# Patient Record
Sex: Female | Born: 1935 | Race: White | Hispanic: No | Marital: Married | Smoking: Never smoker
Health system: Southern US, Community
[De-identification: ages and names within clinical notes are randomized; demographics above are authoritative.]

## PROBLEM LIST (undated history)

## (undated) DIAGNOSIS — I4891 Unspecified atrial fibrillation: Secondary | ICD-10-CM

## (undated) DIAGNOSIS — N390 Urinary tract infection, site not specified: Secondary | ICD-10-CM

## (undated) DIAGNOSIS — K219 Gastro-esophageal reflux disease without esophagitis: Secondary | ICD-10-CM

## (undated) DIAGNOSIS — M810 Age-related osteoporosis without current pathological fracture: Secondary | ICD-10-CM

## (undated) DIAGNOSIS — E079 Disorder of thyroid, unspecified: Secondary | ICD-10-CM

## (undated) DIAGNOSIS — I10 Essential (primary) hypertension: Secondary | ICD-10-CM

## (undated) DIAGNOSIS — K5900 Constipation, unspecified: Secondary | ICD-10-CM

---

## 2000-03-06 ENCOUNTER — Encounter: Admission: RE | Admit: 2000-03-06 | Discharge: 2000-03-06 | Payer: Self-pay | Admitting: Family Medicine

## 2000-03-06 ENCOUNTER — Encounter: Payer: Self-pay | Admitting: Family Medicine

## 2000-10-25 ENCOUNTER — Encounter: Payer: Self-pay | Admitting: Family Medicine

## 2000-10-25 ENCOUNTER — Encounter: Admission: RE | Admit: 2000-10-25 | Discharge: 2000-10-25 | Payer: Self-pay | Admitting: Family Medicine

## 2000-10-31 ENCOUNTER — Encounter: Admission: RE | Admit: 2000-10-31 | Discharge: 2000-10-31 | Payer: Self-pay | Admitting: Family Medicine

## 2000-10-31 ENCOUNTER — Encounter: Payer: Self-pay | Admitting: Family Medicine

## 2001-05-18 ENCOUNTER — Encounter: Payer: Self-pay | Admitting: Family Medicine

## 2001-05-18 ENCOUNTER — Encounter: Admission: RE | Admit: 2001-05-18 | Discharge: 2001-05-18 | Payer: Self-pay | Admitting: Family Medicine

## 2001-12-04 ENCOUNTER — Encounter: Admission: RE | Admit: 2001-12-04 | Discharge: 2001-12-04 | Payer: Self-pay | Admitting: Family Medicine

## 2001-12-04 ENCOUNTER — Encounter: Payer: Self-pay | Admitting: Family Medicine

## 2001-12-25 ENCOUNTER — Encounter: Admission: RE | Admit: 2001-12-25 | Discharge: 2001-12-25 | Payer: Self-pay | Admitting: Family Medicine

## 2001-12-25 ENCOUNTER — Encounter: Payer: Self-pay | Admitting: Family Medicine

## 2002-10-03 ENCOUNTER — Encounter: Payer: Self-pay | Admitting: Family Medicine

## 2002-10-03 ENCOUNTER — Encounter: Admission: RE | Admit: 2002-10-03 | Discharge: 2002-10-03 | Payer: Self-pay | Admitting: Family Medicine

## 2003-02-07 ENCOUNTER — Encounter: Admission: RE | Admit: 2003-02-07 | Discharge: 2003-02-07 | Payer: Self-pay | Admitting: Family Medicine

## 2003-02-07 ENCOUNTER — Encounter: Payer: Self-pay | Admitting: Family Medicine

## 2003-04-17 ENCOUNTER — Encounter: Payer: Self-pay | Admitting: Family Medicine

## 2003-04-17 ENCOUNTER — Ambulatory Visit (HOSPITAL_COMMUNITY): Admission: RE | Admit: 2003-04-17 | Discharge: 2003-04-17 | Payer: Self-pay | Admitting: Family Medicine

## 2003-06-04 ENCOUNTER — Encounter: Payer: Self-pay | Admitting: Family Medicine

## 2003-06-04 ENCOUNTER — Encounter: Admission: RE | Admit: 2003-06-04 | Discharge: 2003-06-04 | Payer: Self-pay | Admitting: Family Medicine

## 2003-06-14 ENCOUNTER — Ambulatory Visit (HOSPITAL_COMMUNITY): Admission: RE | Admit: 2003-06-14 | Discharge: 2003-06-14 | Payer: Self-pay | Admitting: Neurology

## 2003-08-19 ENCOUNTER — Encounter: Payer: Self-pay | Admitting: Orthopedic Surgery

## 2003-08-19 ENCOUNTER — Inpatient Hospital Stay (HOSPITAL_COMMUNITY): Admission: RE | Admit: 2003-08-19 | Discharge: 2003-08-21 | Payer: Self-pay | Admitting: Orthopedic Surgery

## 2003-08-19 ENCOUNTER — Encounter (INDEPENDENT_AMBULATORY_CARE_PROVIDER_SITE_OTHER): Payer: Self-pay

## 2004-09-14 ENCOUNTER — Encounter: Admission: RE | Admit: 2004-09-14 | Discharge: 2004-09-14 | Payer: Self-pay | Admitting: Family Medicine

## 2004-11-24 ENCOUNTER — Inpatient Hospital Stay (HOSPITAL_COMMUNITY): Admission: RE | Admit: 2004-11-24 | Discharge: 2004-11-29 | Payer: Self-pay | Admitting: Orthopaedic Surgery

## 2005-12-20 ENCOUNTER — Encounter: Admission: RE | Admit: 2005-12-20 | Discharge: 2005-12-20 | Payer: Self-pay | Admitting: Family Medicine

## 2006-03-14 ENCOUNTER — Encounter: Admission: RE | Admit: 2006-03-14 | Discharge: 2006-03-14 | Payer: Self-pay | Admitting: Family Medicine

## 2007-02-06 ENCOUNTER — Encounter: Admission: RE | Admit: 2007-02-06 | Discharge: 2007-02-06 | Payer: Self-pay | Admitting: Family Medicine

## 2007-08-23 ENCOUNTER — Ambulatory Visit (HOSPITAL_COMMUNITY): Admission: RE | Admit: 2007-08-23 | Discharge: 2007-08-24 | Payer: Self-pay | Admitting: Orthopedic Surgery

## 2008-03-21 ENCOUNTER — Encounter: Admission: RE | Admit: 2008-03-21 | Discharge: 2008-03-21 | Payer: Self-pay | Admitting: Orthopedic Surgery

## 2008-08-20 ENCOUNTER — Encounter: Admission: RE | Admit: 2008-08-20 | Discharge: 2008-08-20 | Payer: Self-pay | Admitting: Family Medicine

## 2008-08-25 ENCOUNTER — Encounter: Admission: RE | Admit: 2008-08-25 | Discharge: 2008-08-25 | Payer: Self-pay | Admitting: Family Medicine

## 2009-05-15 ENCOUNTER — Inpatient Hospital Stay (HOSPITAL_COMMUNITY): Admission: AD | Admit: 2009-05-15 | Discharge: 2009-05-17 | Payer: Self-pay | Admitting: Orthopedic Surgery

## 2010-08-25 IMAGING — CR DG CHEST 2V
2 series · 2 of 2 positions shown · non-contrast
Comparison: 08/16/2007

CLINICAL DATA: Preoperative respiratory exam.  Chronic back pain.

CHEST - 2 VIEW

[view not recorded (1 of 2)]
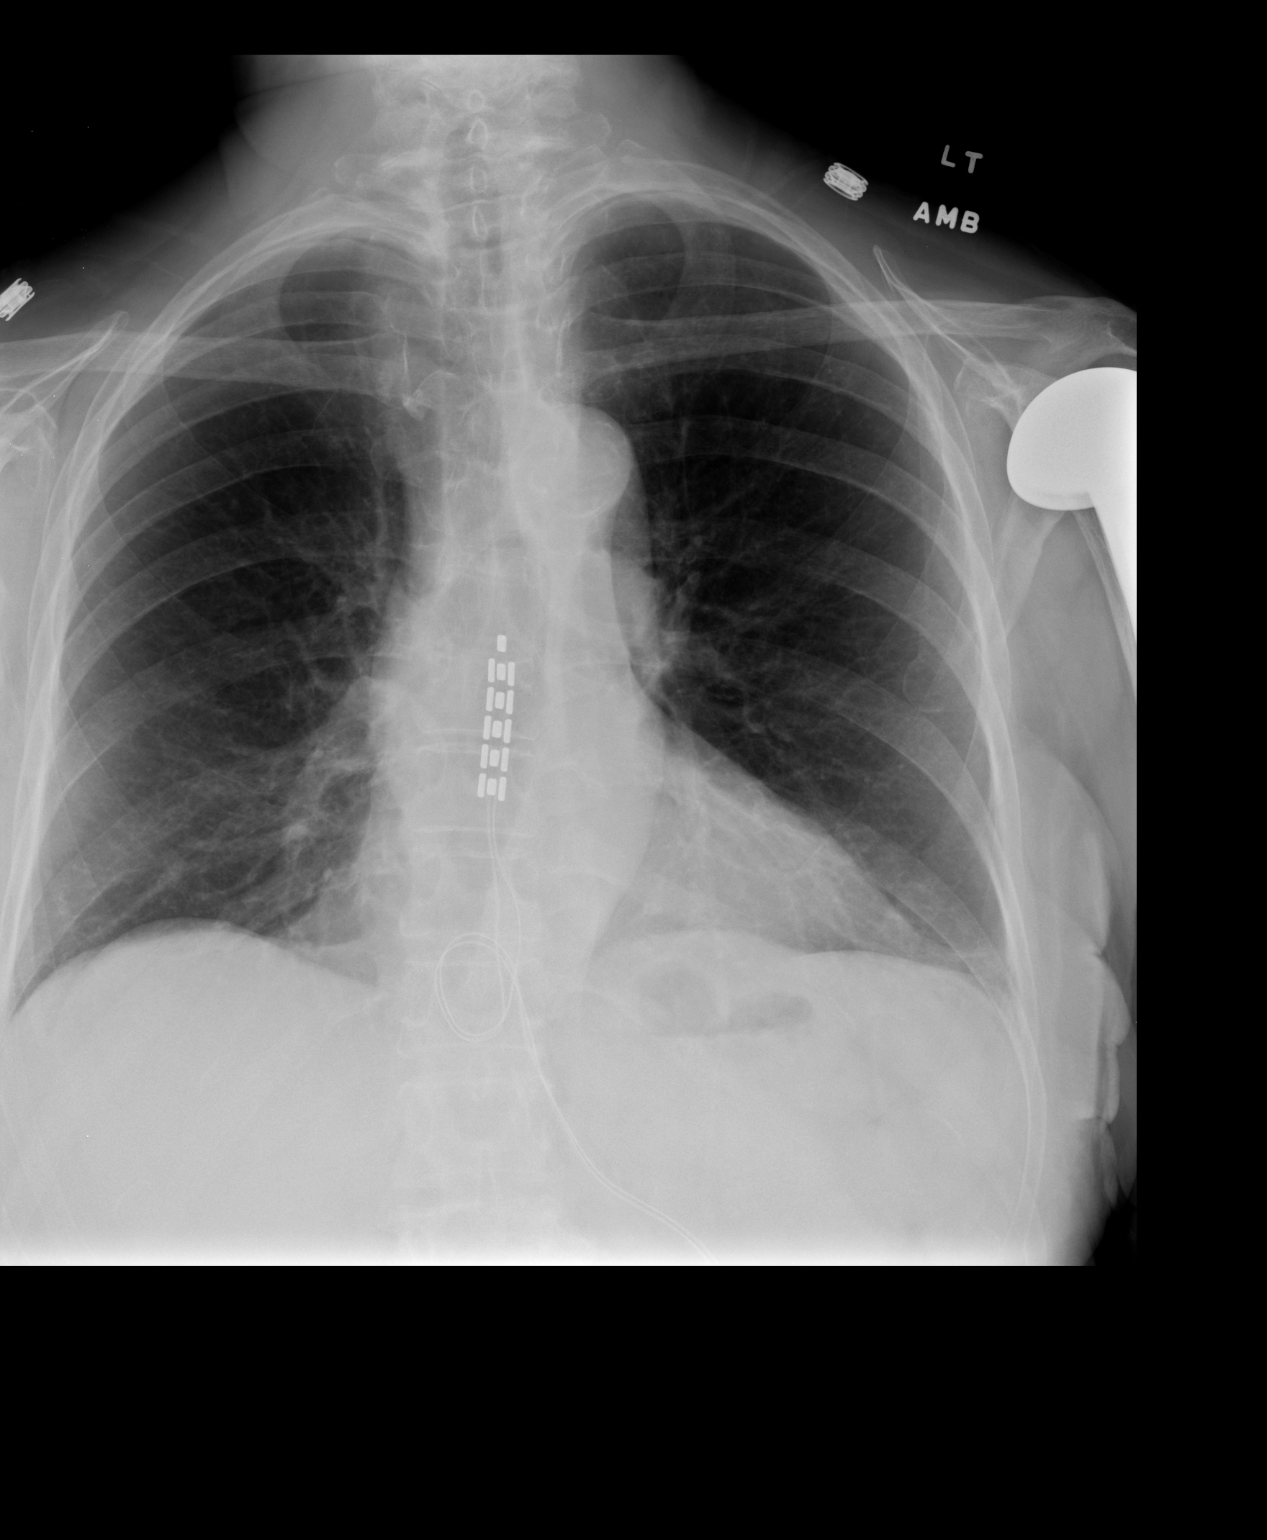

[view not recorded (2 of 2)]
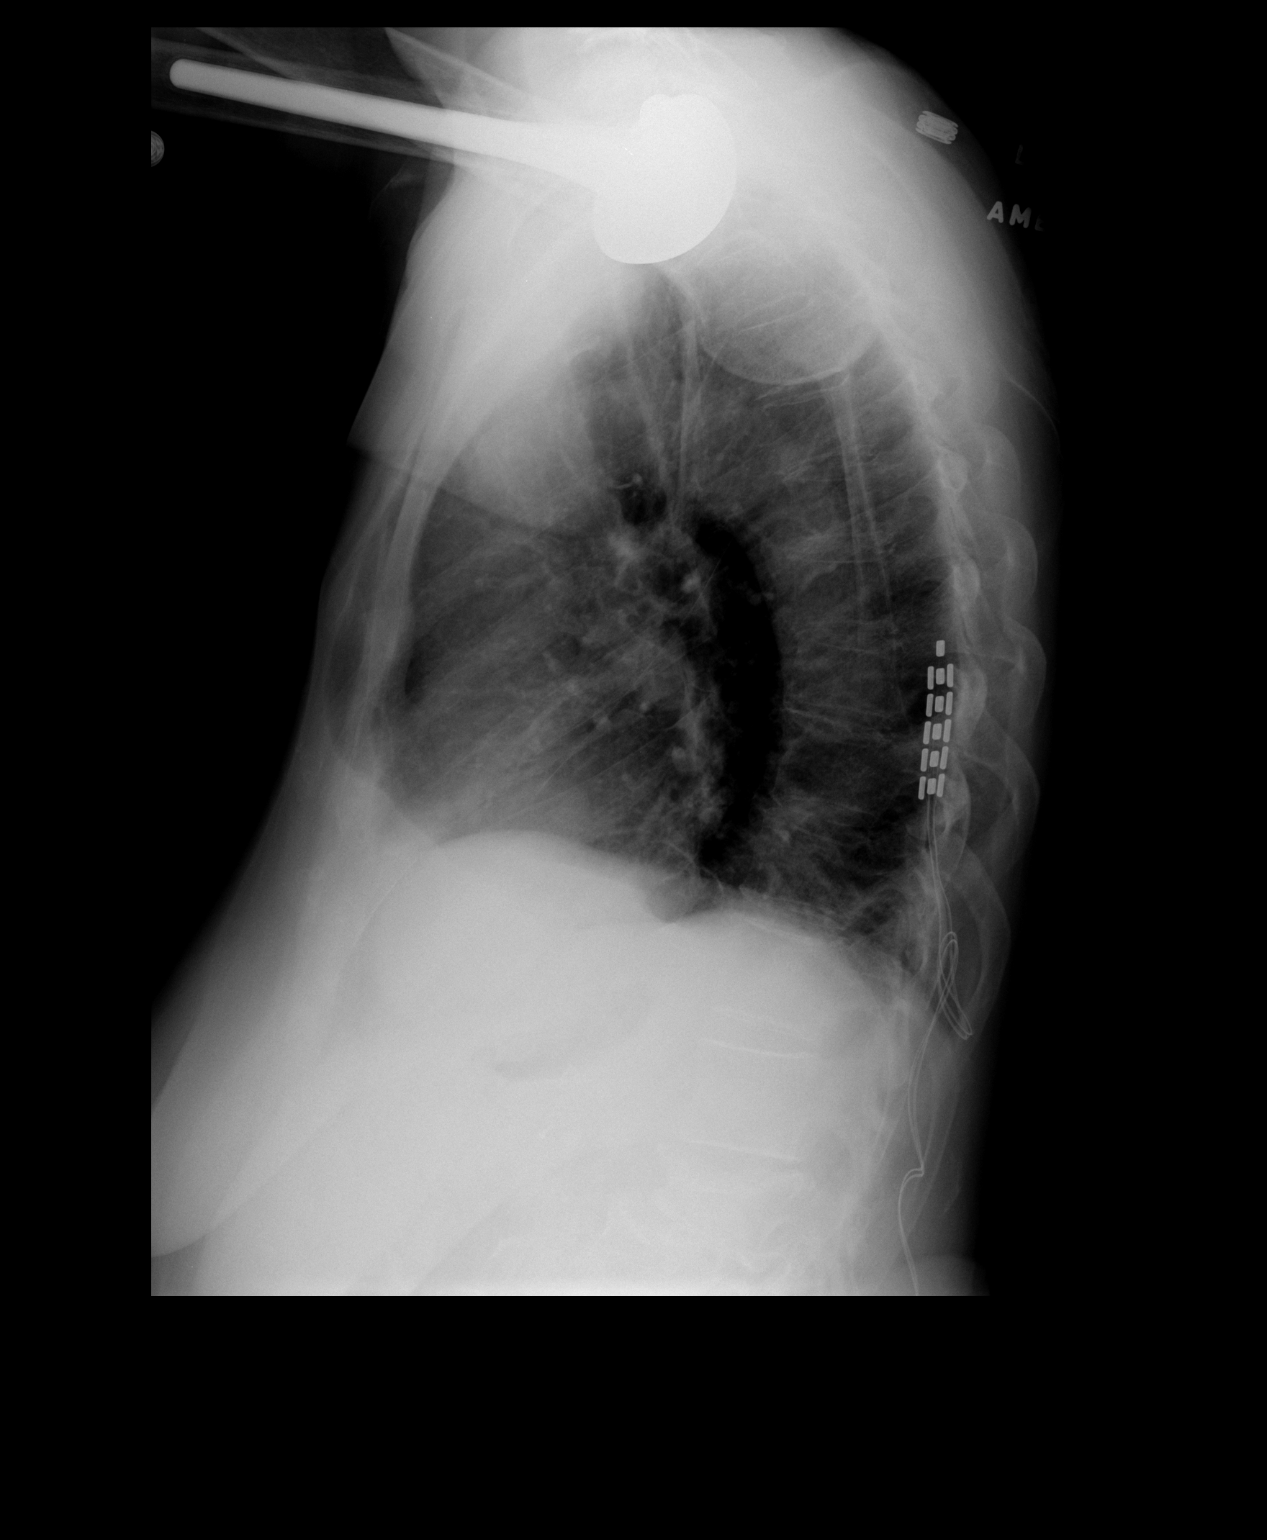

[2 of 2 positions shown; findings below may reference images not displayed]

FINDINGS: The patient has a spinal cord stimulator extending  from
the inferior aspect of T7 to the midbody of T9.

The heart size and vascularity are normal.  The lungs are clear
except for some minimal atelectasis or scarring at the bases.  No
acute bony abnormalities.  Proximal left humeral prosthesis.
IMPRESSION: No acute abnormalities.

## 2011-03-07 LAB — BASIC METABOLIC PANEL
BUN: 16 mg/dL (ref 6–23)
BUN: 21 mg/dL (ref 6–23)
CO2: 27 mEq/L (ref 19–32)
CO2: 31 mEq/L (ref 19–32)
Calcium: 8.2 mg/dL — ABNORMAL LOW (ref 8.4–10.5)
Calcium: 9.6 mg/dL (ref 8.4–10.5)
Chloride: 98 mEq/L (ref 96–112)
Chloride: 98 mEq/L (ref 96–112)
Creatinine, Ser: 0.9 mg/dL (ref 0.4–1.2)
Creatinine, Ser: 0.98 mg/dL (ref 0.4–1.2)
GFR calc Af Amer: >60 mL/min (ref >60)
GFR calc Af Amer: >60 mL/min (ref >60)
GFR calc non Af Amer: >60 mL/min (ref >60)
GFR calc non Af Amer: 55 mL/min — ABNORMAL LOW (ref >60)
Glucose, Bld: 106 mg/dL — ABNORMAL HIGH (ref 70–99)
Glucose, Bld: 91 mg/dL (ref 70–99)
Potassium: 3.6 mEq/L (ref 3.5–5.1)
Potassium: 3.4 mEq/L — ABNORMAL LOW (ref 3.5–5.1)
Sodium: 133 mEq/L — ABNORMAL LOW (ref 135–145)
Sodium: 136 mEq/L (ref 135–145)

## 2011-03-07 LAB — TYPE AND SCREEN
ABO/RH(D): A POS
Antibody Screen: NEGATIVE

## 2011-03-07 LAB — CBC
HCT: 34.4 % — ABNORMAL LOW (ref 36.0–46.0)
Hemoglobin: 11.9 g/dL — ABNORMAL LOW (ref 12.0–15.0)
MCHC: 34.7 g/dL (ref 30.0–36.0)
MCV: 112.1 fL — ABNORMAL HIGH (ref 78.0–100.0)
Platelets: 196 10*3/uL (ref 150–400)
RBC: 3.07 MIL/uL — ABNORMAL LOW (ref 3.87–5.11)
RBC: 3.84 MIL/uL — ABNORMAL LOW (ref 3.87–5.11)
RDW: 12.7 % (ref 11.5–15.5)
WBC: 6.4 10*3/uL (ref 4.0–10.5)
WBC: 8.3 10*3/uL (ref 4.0–10.5)

## 2011-03-07 LAB — URINALYSIS, ROUTINE W REFLEX MICROSCOPIC
Ketones, ur: NEGATIVE mg/dL
Protein, ur: NEGATIVE mg/dL
Urobilinogen, UA: 0.2 mg/dL (ref 0.0–1.0)

## 2011-03-07 LAB — DIFFERENTIAL
Basophils Relative: 0 % (ref 0–1)
Eosinophils Relative: 2 % (ref 0–5)
Lymphocytes Relative: 37 % (ref 12–46)
Monocytes Relative: 8 % (ref 3–12)
Neutrophils Relative %: 53 % (ref 43–77)

## 2011-03-07 LAB — URINE MICROSCOPIC-ADD ON

## 2011-03-07 LAB — PROTIME-INR
INR: 0.9 (ref 0.00–1.49)
Prothrombin Time: 11.8 seconds (ref 11.6–15.2)

## 2011-03-07 LAB — ABO/RH: ABO/RH(D): A POS

## 2011-04-12 NOTE — Op Note (Signed)
NAMESHAKEIRA, Lydia Barber              ACCOUNT NO.:  [id number]   MEDICAL RECORD NO.:  [id number]          PATIENT TYPE:  OIB   LOCATION:  5019                         FACILITY:  [facility]   PHYSICIAN:  Almedia Balls. Ranell Patrick, M.D. DATE OF BIRTH:  06/11/1929   DATE OF PROCEDURE:  05/15/2009  DATE OF DISCHARGE:                               OPERATIVE REPORT   PREOPERATIVE DIAGNOSIS:  Right shoulder osteoarthritis.   POSTOPERATIVE DIAGNOSIS:  Right shoulder osteoarthritis.   PROCEDURE PERFORMED:  Right total shoulder arthroplasty using DePuy  Global Advantage System.   ATTENDING SURGEON:  Almedia Balls. Ranell Patrick, MD   ASSISTANT:  Donnie Coffin. Dixon, PA-C   ANESTHESIA:  General anesthesia plus interscalene block anesthesia was  used.   ESTIMATED BLOOD LOSS:  Minimal.   FLUID REPLACEMENT:  1200 mL crystalloid.   URINE OUTPUT:  Not recorded.   INSTRUMENT COUNT:  Correct.   COMPLICATIONS:  No complications.   Perioperative antibiotics were given.   INDICATIONS:  The patient is a 77 year old female with worsening right  shoulder pain secondary to arthritis.  The patient has failed  conservative management at this point, presents for operative treatment  with disabling pain desiring pain relief with this total shoulder  arthroplasty procedure.  Informed consent was obtained.   DESCRIPTION OF PROCEDURE:  After an adequate level of anesthesia was  achieved, the patient was positioned in the modified beach chair  position.  Right shoulder was examined under anesthesia shoulder.  The  patient had very very loose shoulder with significant laxity in all  planes and passive external rotation now to about 90 degrees, forward  elevation up to 180.  After sterile prep and drape of the right  shoulder, we entered the shoulder through a standard deltopectoral  approach starting at the coracoid extending down to the anterior humeral  shaft.  Dissection was down to the subcutaneous tissue using Bovie.  Deltopectoral interval identified with cephalic vein, taken laterally.  Conjoined tendon taken medially.  We released the subscapularis directly  off the lesser tuberosity placing #2 FiberWire sutures in a modified  Mason-Allen suture technique into the tendon. The rotator interval  released.  The soft tissue was released off the anteroinferior humeral  shaft.  We then went ahead and progressively externally rotated the  humerus and then we were able to perform neck cut using neck cut guide,  resecting to the level of the rotator cuff insertion on the greater  tuberosity.  Once we did that, we went ahead and removed osteophytes off  the humeral side.  We then went ahead and subluxed the humerus  posteriorly, placing retractors, and then exposing glenoid 360 degrees  of the complete release of the glenoid labrum and removal.  Subscap  again was freed off of the scapular neck and placed the scapular neck  retractor.  We marked our superior 12 o'clock, 6 o'clock, 3 and 9  positions using inferoscapular neck as our guide and then went ahead and  placed our central drill hole and drill brought out.  We then went ahead  and excised it and this was going to  be a 40 extra small.  We used a 40  reamer and reamed in the subchondral bone but not violating the  subchondral plate.  We had good bony support all the way around to get  [place] a 40 extra small.  We then made our preparation for the keeled  implant as anchor peg was not an option.  We drilled superiorly and  inferiorly and used a burr to remove the bone in between the drill holes  and then used our thin impactor to impact the bone and we went ahead and  placed our 40 extra small implant in place.  With that trial in place,  we trialed a 40 x 21 head.  We were happy with soft tissue balancing,  removed the trial glenoid.  We went ahead and dried the glenoid  thoroughly and then cemented the 40 extra small glenoid in place.  We  trialed with a 40  x 21 head and we were happy with that.  We removed the  trial stem, placed two #2 FiberWire sutures into the shaft with suture  limbs exiting out the lesser tuberosity for subscap repair.  We then  placed our impactor implant, which was a sized 12 of Global Advantage  stem, which we had done with broaching and remaining.  She gets to the  12 implant, this fits very nicely.  We did do a little bit extra  cancellous bone in the medial calcar region.  Once that stem was in  place, we had selected our 40 x 21 implant and impacted that in place.  We reduced the shoulder.  We are happy with her soft tissue balancing.  We had appropriate version, which is about 10 degrees retroversion on  the humeral side and at this point, we went ahead and repaired her  subscap anatomically using sutures through bone.  Also, the sutures were  placed in the free end of the subscapularis and rotator interval sutures  x3.  With a solid subscap repair, we went ahead and checked the shoulder  for stability.  It was nice and stable, was perhaps a little more stable  than it was before surgery.  She is just loose and she does have some  laxity built in, but we used a 21 implant.  Her resection level was  appropriate and we did not resect too much bone on the glenoid side, so  we were pleased with her result and thoroughly irrigated and closed the  deltopectoral interval with 0 Vicryl suture, followed by 2-0 Vicryl  subcutaneous closure and 4-0 Monocryl for skin.  Steri-Strips applied  followed by sterile dressing.  The patient tolerated the surgery well.      Almedia Balls. Ranell Patrick, M.D.  Electronically Signed     SRN/MEDQ  D:  05/15/2009  T:  05/16/2009  Job:  [id number]

## 2011-04-12 NOTE — Op Note (Signed)
NAMECHALLIS, CRILL              ACCOUNT NO.:  [id number]   MEDICAL RECORD NO.:  [id number]          PATIENT TYPE:  AMB   LOCATION:  DAY                          FACILITY[facility]   PHYSICIAN:  Alvy Beal, MD    DATE OF BIRTH:  December 02, 1928   DATE OF PROCEDURE:  08/23/2007  DATE OF DISCHARGE:                               OPERATIVE REPORT   PREOPERATIVE DIAGNOSIS:  Chronic bilateral lower extremity pain and hip  pain.   POSTOPERATIVE DIAGNOSIS:  Chronic bilateral lower extremity pain and hip  pain.   OPERATIVE PROCEDURE:  Permanent spinal cord stimulator implantation and  T9-T10 laminectomy.   HISTORY:  This is a very pleasant 77 year old woman who has been having  chronic pain for several years now.  Dr. Ethelene Hal performed a trial spinal  cord stimulator placement and the patient noted significant pain relief.  As such, she presented to me for definitive permanent implantation.  After discussing all risks, benefits and alternatives to surgery,  consent was obtained.   OPERATIVE NOTE:  The patient was brought to the operating room and  placed supine on the operating table.  After successful induction of  general anesthesia and endotracheal intubation, SCDs were applied.  She  was turned prone onto a Wilson frame.  All bony prominences were well-  padded.  The back was then prepped and draped in a standard fashion.  A  midline mid thoracic incision was made and sharp dissection was carried  out down to the level of the deep fascia.  The deep fascia was sharply  incised and exposed the spinous process and lamina of T8, T9 and T10.  I  then, using fluoroscopy, counted up from L5 to determine the appropriate  level.  Once I confirmed the T9 spinous process, I then used a double-  action Leksell rongeur to resect the spinous processes of T9 and a  portion of that of T10.  I then used this a 2- and 3-mm Kerrison rongeur  to resect the ligamentum flavum and complete the laminotomy of  T9.  At  this point, I had an excellent visualization of the thoracic spinal cord  at the level of T9.  I then used the dural elevator to create a space  between the ligamentum flavum and underlying thecal sac.  I then passed  the appropriate-sized implantable lead along the spinal cord.  I ensured  that I had the correct part dorsal.  At this point, I then took an x-ray  to confirm that it was truly midline and was actually above where the  trial implant was placed.  At this point, with satisfactory position, I  then affixed the leads to the fascia with a 2-0 FiberWire stitch.  I  then looped it appropriately and then used the passer to pass this  submuscular down to the left-sided fascial skin incision that I made for  the battery.  The battery was placed at a depth of 2 cm.  I bluntly  dissected and created a cavity, connected to leads and then did a trial.  Once we had confirmed that  the leads were working, I implanted the  battery and the leads in the plane that I had developed and secured it  to the fascia with a 2-0 Ethibond suture.  I then irrigated both wounds  copiously with normal saline, placed a thrombin-soaked Gelfoam patty  over the laminotomy site and then closed the deep fascia with  interrupted #1 Vicryl sutures, 2-0 Vicryl sutures for the superficial  tissues,  3-0 Monocryl for the thoracic wound and a 2-0 Prolene for the battery  wound.  Steri-Strips and dry dressing were applied.  She was extubated  and transferred to the PACU without incident.  At the end of the case,  all needle and sponge counts were correct.      Alvy Beal, MD  Electronically Signed     DDB/MEDQ  D:  08/23/2007  T:  08/24/2007  Job:  [id number]

## 2011-04-15 NOTE — H&P (Signed)
NAME:  Lydia Barber, Lydia Barber                        ACCOUNT NO.:  [id number]   MEDICAL RECORD NO.:  [id number]                   PATIENT TYPE:  INP   LOCATION:  NA                                   FACILITY:  [facility]   PHYSICIAN:  Almedia Balls. Ranell Patrick, M.D.              DATE OF BIRTH:  06-12-1929   DATE OF ADMISSION:  08/19/2003  DATE OF DISCHARGE:                                HISTORY & PHYSICAL   CHIEF COMPLAINT:  Pain in my left shoulder.   PRESENT ILLNESS:  A 77 year old lady seen for primary problem of left  shoulder pain.  The patient has had at least two years of shoulder  discomfort.  She has had both intra-articular as well as subacromial  injection done in the not too distant past, which has really not helped her  more then one day.  She is a very active lady, uses her left hand and upper  extremity actually more then her right.  She finds she has extreme  difficulty in movement as well as even sleeping due to the pain in her left  shoulder.  We sent the patient for an MRI of the left shoulder, and it  showed marked glenohumeral arthropathy with diffuse degenerative tearing of  the labrum.  A large joint effusion was seen with multiple ossified loose  bodies.  This, according to Dr. Purcell Mouton, Radiologist, was compatible with  longstanding osteoarthritis and arthropathy.  Fortunately, the patient had  an intact rotator cuff, and the biceps tendon was intact.   The patient has been seen and cleared preoperatively by Dr. Lupe Carney of  [facility].   PAST MEDICAL HISTORY:  This patient has been in relatively good health.  She  has hypertension.  She has some shortness of breath secondary to smoking.  She has been treated for hypothyroidism and hypertension as well as some  anxiety.   CURRENT MEDICATIONS:  1. Alprazolam 0.5 mg one t.i.d.  2. Triamterene/HCTZ 75/50 one daily.  3. Toprol-XL 50 mg one daily.  4. Synthroid 0.75 mg one daily.   ALLERGIES:   CODEINE.   FAMILY PHYSICIAN:  Dr. Lupe Carney is her family physician.   PAST SURGICAL HISTORY:  1. Tonsillectomy at 77 years of age.  2. Partial hysterectomy in 1969.   FAMILY HISTORY:  Positive for heart disease, hypertension, stroke, and  arthritis.   SOCIAL HISTORY:  The patient is married, retired, smokes a half a pack of  cigarettes per day, has no intake of ETOH products.  Her husband and  daughter will be caregivers after surgery.   REVIEW OF SYSTEMS:  CNS:  No seizures, shoulder paralysis, numbness, double  vision.  RESPIRATORY:  She has some DOE, but no productive cough, no  hemoptysis.  CARDIOVASCULAR:  No chest pain, no angina, no orthopnea.  GASTROINTESTINAL: No nausea, vomiting, melena, bloody stool.  GENITOURINARY:  No discharge, dysuria, hematuria.  MUSCULOSKELETAL:  Primarily in present  illness.   PHYSICAL EXAMINATION:  GENERAL:  Alert, cooperative, friendly, 77 year old  white female, somewhat petite, who is accompanied by her husband.  VITAL SIGNS:  Blood pressure 144/86, pulse 80, respirations 12.  HEENT:  Normocephalic.  PERRLA.  EOM intact.  Oropharynx is clear.  CHEST:  Clear to auscultation.  No rhonchi, no rales, no wheezes.  HEART:  Regular rate and rhythm.  No murmurs were heard.  ABDOMEN:  Soft, nontender.  Liver and spleen not felt.  GENITALIA/RECTAL/PELVIC/BREASTS:  Not done; not pertinent to present  illness.  EXTREMITIES:  The patient has extreme range of motion of the left shoulder,  certainly not attempted today.  She does have pain with some mild range of  motion, and she cannot lift her whole arm.  She uses the glenohumeral  musculature for most activity.   ADMITTING DIAGNOSES:  1. Left shoulder osteoarthritis.  2. Hypothyroidism.  3. Hypertension.  4. Anxiety.   PLAN:  The patient will undergo left shoulder hemiarthroplasty versus total  shoulder replacement.      Dooley L. Cherlynn June.                 Almedia Balls. Ranell Patrick,  M.D.    DLU/MEDQ  D:  08/14/2003  T:  08/14/2003  Job:  [id number]   cc:   L. Lupe Carney, M.D.  [address] [place]  [place] [address]  Fax: [phone]

## 2011-04-15 NOTE — Op Note (Signed)
NAMESHAHARA, HARTSFIELD              ACCOUNT NO.:  [id number]   MEDICAL RECORD NO.:  [id number]          PATIENT TYPE:  INP   LOCATION:  2550                         FACILITY:  [facility]   PHYSICIAN:  Sharolyn Douglas, M.D.        DATE OF BIRTH:  [birth year]   DATE OF PROCEDURE:  11/24/2004  DATE OF DISCHARGE:                                 OPERATIVE REPORT   DIAGNOSIS:  Lumbar degenerative scoliosis with spinal stenosis.   PROCEDURES:  1.  Lumbar laminectomy, L3-4 and L4-5, with wide decompression of the common      thecal sac and nerve roots bilaterally.  2.  Transforaminal lumbar interbody fusion, L3-4 and L4-5, with placement of      two PEEK prosthetic cages packed with bone morphogenic protein.  3.  Segmental pedicle screw instrumentation, L3 through L5, using the Spinal      Concepts system.  4.  Posterior spinal arthrodesis, L3 through L5.  5.  Local autogenous bone graft supplemented with allograft and bone      morphogenic protein.  6.  Neurologic monitoring with testing of six pedicle screws and monitoring      of free-running electromyography x3 hours.   SURGEON:  Sharolyn Douglas, M.D.   ASSISTANT:  Verlin Fester, P.A.   ANESTHESIA:  General endotracheal.   COMPLICATIONS:  None.   INDICATIONS:  The patient is a pleasant 77 year old female with degenerative  lumbar scoliosis and spinal stenosis.  She has had progressively worsening  back and right leg pain refractory to conservative treatment modalities and  elected to undergo lumbar decompression and fusion in hopes of improving her  symptoms.  She knows the risks and benefits, including nonbenefit, adjacent  segment degeneration, hardware failure, pseudoarthrosis, and the need for  additional surgery.   PROCEDURE:  The patient was properly identified in the holding area and  underwent general endotracheal anesthesia without difficulty, given  prophylactic IV antibiotics.  Carefully positioned prone on the Wilson  frame, all  bony prominences padded, face and eyes protected at all times,  back prepped and draped in the usual sterile fashion.  A midline incision  made from L2 to L5.  Dissection carried sharply through the deep fascia.  The paraspinal muscles elevated out over the facet joints, exposing the  transverse process of L3, L4, and L5 bilaterally.  We found the joints of L3-  4 and L4-5 to be hypertrophied and partially subluxated.  The L2-3 joint was  relatively preserved.  Deep retractors were placed.  Intraoperative x-ray  confirmed location.  We then completed a wide laminectomy, removing the  entire lamina and spinous process of L3 and L4.  We found underlying spinal  stenosis secondary to facet hypertrophy, ligamentum flavum hypertrophy, and  subluxation of the facet joints.  The lateral recess was stenotic, right  greater than left.  We widely decompressed the L3, L4, and L5 nerve roots  bilaterally.  We then turned our attention to placing pedicle screws at L3,  L4, and L5.  Using anatomic probing technique, we could palpate the pedicles  from within the spinal  canal.  The bone was very soft.  The screw purchase  was marginal.  We utilized 6.5 x 50 mm screws at L3, 6.5 x 45 mm screws in  L4 and L5.  Each screw was stimulated using triggered EMGs, and there were  no deleterious changes.  We then turned our attention to completing a  transforaminal lumbar interbody fusion on the right side at L3-4 and L4-5.  The remaining facet joint was osteotomized at both levels.  At L3-4 the disk  space was severely collapsed.  In the process of entering the disk space,  the superior end plate was breached.  The disk space was scraped of all disk  material.  We then scraped cartilaginous end plates.  We were able to dilate  the disk up to 8 mm.  We then placed an 8 mm PEEK cage into the inferior end  plate of L3 to fill the void where the end plate had been reached.  We then  placed a second 8 mm cage which was  packed with bone morphogenic protein  into the interspace and across the midline in an oblique fashion.  A similar  procedure was carried out at L4-5.  We were able to dilate that disk space  up to 12 mm.  We packed the disk space tightly with bone morphogenic protein  as well as local autogenous bone graft from the laminectomy.  We monitored  free-running EMGs throughout the T-LIF procedures, and there were no  deleterious changes.  We then turned our attention to completing the  posterior spinal arthrodesis by decorticating the transverse processes of  L3, L4, and L5 bilaterally.  The remaining local autogenous bone graft along  with 15 mL of Grafton allograft packed into the lateral gutters.  We placed  short-segment titanium rods into polyaxial heads.  We applied compression  across the left side of the construct before shearing off the locking caps.  A cross-connector was placed, the wound was irrigated.  Bleeding controlled  by bipolar electrocautery and Gelfoam.  A deep Hemovac drain left in place.  The deep fascia closed with a running #1 Vicryl suture, subcutaneous layer  closed with 2-0 Vicryl followed by a running 3-0 subcuticular Vicryl suture  on the skin edges.  Benzoin and Steri-Strips placed.  A sterile dressing  applied.  The patient was turned supine and exited without difficulty and  transferred to recovery in stable condition, able to move her upper and  lower extremities.      MC/MEDQ  D:  11/24/2004  T:  11/24/2004  Job:  [id number]

## 2011-04-15 NOTE — Op Note (Signed)
NAMEKIELEY, AKTER                        ACCOUNT NO.:  [id number]   MEDICAL RECORD NO.:  [id number]                   PATIENT TYPE:  INP   LOCATION:  0473                                 FACILITY:  [facility]   PHYSICIAN:  Almedia Balls. Ranell Patrick, M.D.              DATE OF BIRTH:  01/22/1929   DATE OF PROCEDURE:  08/19/2003  DATE OF DISCHARGE:                                 OPERATIVE REPORT   PREOPERATIVE DIAGNOSIS:  Left shoulder osteoarthritis.   POSTOPERATIVE DIAGNOSIS:  Left shoulder osteoarthritis.   PROCEDURE PERFORMED:  Left shoulder hemiarthroplasty.   SURGEON:  Almedia Balls. Ranell Patrick, M.D.   FIRST ASSISTANT:  Dooley L. Cherlynn June.   ANESTHESIA:  General anesthesia was used plus intrascalene block.   ESTIMATED BLOOD LOSS:  Minimal.   FLUIDS REPLACED:  2000 mL crystalloid.   INSTRUMENT COUNT:  Correct.   COMPLICATIONS:  None.   ANTIBIOTICS:  Perioperative antibiotics were given.   INDICATIONS FOR PROCEDURE:  The patient is a 77 year old female who presents  with end-stage left shoulder arthritis. She is bone on bone on axial lateral  radiographs. The patient has no evidence of posterior glenoid bony erosion  and has an intact rotator cuff on her MRI scan. Due to failure of  nonoperative management including activity modifications, injections and  pain medications, the patient now presents for surgical hemiarthroplasty  versus total shoulder arthroplasty with likely hemi. Informed consent was  obtained.   DESCRIPTION OF PROCEDURE:  After an adequate level of anesthesia was  achieved, the patient was positioned in the modified beach chair position,  left shoulder examined under anesthesia and she had external rotation to 45  degrees preoperatively. Internal rotation was at beltline forward flexion  about 100 degrees, abduction 70 degrees. After completion of the exam under  anesthesia, the left shoulder was prepped and draped in its entirety in the  usual sterile  fashion. A longitudinal skin incision was created started  below the level of the coracoid extending down towards the anterior portion  of the deltoid insertion. Dissection was carried sharply down through the  subcutaneous tissues using Metzenbaum scissors. Bovie electrocautery was  used to control hemostasis. The deltopectoral interval was easily  identified, the cephalic vein was taken laterally with the deltoid. This  exposed the clavipectoral fascia which was incised. We also took down a  little bit of the pectoralis the top centimeter or so of it directly on the  humeral side again to facilitate visualization. The conjoined tendon was  taken medially, this exposed the subscapularis, three sisters were  coagulated. At this point, the subscapularis was taken off just medial to  the lesser tuberosity. The capsule was separated and excised, subscap was  retracted exposing the arthritic shoulder. There was noted to be multiple  loose bodies on osteophytes which were removed sharply utilizing a rongeur.  A large synovial mass was identified adjacent to the biceps tendon, this  was  actually removed and sent for pathology which came back no evidence of  pigmented villonodular synovitis, this was simply chronic inflammation with  a lot of macrophages. At this point, the shoulder was dislocated, a  retractor was placed around the rim of the rotator cuff and the head  resection guide was placed with the arm in approximately 30 degrees of  external rotation to provide the 30 degree retroverted cut at the  appropriate neck cutting angle. This was done with an oscillating saw. A  Crego retractor was placed inferiorly in the past to ensure that no inferior  structures would be injured. The axillary nerve was protected and palpated  during this portion of the procedure to make sure it was free and clear of  the saw. With the neck resected, canal preparation was done up to a size 10  global vantage  prosthesis. The head was trialed and this was noted to be a  52 x 21 eccentric head. With this trialed, appropriate soft tissue balance  was noted with approximately a 50% posterior translation within the glenoid  and appropriate tensioning of the soft tissues. At this point, trial  components are removed, bone graft was obtained from the humeral head and  used to bone graft the metaphysis of the humerus. A stem was partially  placed in with attention towards appropriate version. The graft was applied  in the metaphysis and then the stem was impacted into place gaining  excellent fit and fill. At this point, we again retrialed with the 52 x 21  eccentric and noted again appropriate tension in coverage. The real  component was selected and impacted in the Morris taper, shoulder reduced,  again appropriate range of motion was obtained. The subscapularis was  repaired using 1 mm cotton dacron tape through bone in a mattress fashion.  Rotator interval was reclosed utilizing the dacron suture and also some  Ethibond and then the subcutaneous closure with 2-0 Vicryl and the skin with  running 4-0 Monocryl and Steri-Strips. The patient tolerated the surgery  well and was placed in a shoulder sling with a mobilizer strap and was taken  to the recovery room in stable condition having tolerated the surgery well.  After repair of the subscap, I was easily able to externally rotate to 45  degrees.                                               Almedia Balls. Ranell Patrick, M.D.    SRN/MEDQ  D:  08/19/2003  T:  08/20/2003  Job:  [id number]

## 2011-04-15 NOTE — Discharge Summary (Signed)
Lydia Barber, Lydia Barber              ACCOUNT NO.:  [id number]   MEDICAL RECORD NO.:  [id number]          PATIENT TYPE:  INP   LOCATION:  5011                         FACILITY:  [facility]   PHYSICIAN:  Sharolyn Douglas, M.D.        DATE OF BIRTH:  1929/06/01   DATE OF ADMISSION:  11/24/2004  DATE OF DISCHARGE:  11/29/2004                                 DISCHARGE SUMMARY   ADMITTING DIAGNOSES:  1.  L3-4 and L4-5 spondylolisthesis.  2.  Hypertension.  3.  Hypothyroidism.  4.  Chronic obstructive pulmonary disease.   DISCHARGE DIAGNOSES:  1.  Status post L3-4 and L4-5 posterior spinal fusion, doing well.  2.  Postoperative anemia, requiring transfusion.  3.  Low-grade temperature, resolved prior to discharge.  4.  L3-4 and L4-5 spondylolisthesis.  5.  Hypertension.  6.  Hypothyroidism.  7.  Chronic obstructive pulmonary disease.   CONSULTS:  None.   PROCEDURE:  On November 24, 2004, the patient was taken to the operating  room for L3-4, L4-5 laminectomy and posterior spinal fusion with pedicle  screws and TLIF.  This was done by Sharolyn Douglas, M.D.,  assistant [facility]C.  Anesthesia used was general.   LABORATORIES:  On November 18, 2004, CBC with differential was within normal  limits, with the exception of absolute lymphs of 3.5, slightly elevated.  PT, INR, and PTT normal.  Complete metabolic panel normal, with the  exception of glucose 105, AST 49, ALT 43.  UA was negative.  Blood typing  was type A positive.  Antibody screen was negative.  H&H were monitored  postoperatively and reached a low of 8.8 and 24.5 on November 26, 2004.  She  was transfused 2 units of packed cells, tolerated this well, and hemoglobin  and hematocrit increased nicely by the following day, reaching 10.9 and  31.1, and she was stable for the remainder of her hospital stay.  Basic  metabolic panel from the first day postoperatively was within normal limits,  with the exception of glucose 127.  EKG from  November 18, 2004 showed normal  sinus rhythm, minimum voltage criteria for LVH, may be normal, nonspecific T  wave abnormality which is minor.  There were minor technical difficulties.  Read by Bennington Bing, M.D.  Portable spine was used on November 24, 2004  intraoperatively.   BRIEF HISTORY:  The patient is a 77 year old female who has been having  continuing problems with her back with radiation into the right lower  extremity.  She had had epidural steroid injections which only gave her  short-term relief.  She was also taking antiinflammatories and pain  medications.  Her activity is being significantly limited by this pain.  Unfortunately, her pain is getting significantly worse.  She is having  trouble with activities of daily living, caring for herself.  It was felt  that due to this as well as her x-ray and MRI findings her best course of  management would be the above-listed procedure.  This was discussed with her  at length with Dr. Noel Gerold.  She indicated understanding and  opted to proceed  with surgery.   HOSPITAL COURSE:  On November 24, 2004, the patient was admitted to the  hospital and taken to the operating room for the above-listed procedure.  She tolerated the procedure well, without any intraoperative complications.  She was transferred to the recovery room in stable condition.  There was one  Hemovac drain that was placed intraoperatively.   Postoperatively, routine orthopedic spine protocol was followed, including  prophylactic antibiotics, DVT prophylaxis with SCDs, TED hose, as well as  early mobilization.  Diet was held at n.p.o. and slowly advanced after  flatus back to her regular home diet.  Hemovac drain was pulled on  postoperative day two.  Dressing changes were done on postoperative day two  and continued daily throughout her hospital stay.  Incision looked good  throughout the stay.   On November 26, 2004, her hemoglobin had dropped as previously  dictated.  She was symptomatic.  Therefore, she was transfused 2 units of packed red  blood cells.  This was discussed with her, and she was in agreement.  She  tolerated transfusion well, without any complications.  Her hemoglobin  improved nicely by the following day, increased up to 10.9.   The patient did develop a low-grade temperature of 100.2 on November 27, 2004.  With increased use of incentive spirometer as well as increased  activity, this did gradually resolve.   By November 29, 2004, the patient had met all orthopedic goals.  Medically,  she was stable and ready for discharge home.   DIAGNOSIS:  A 77 year old female status post posterior spinal fusion L3-L5,  doing well.   ACTIVITIES:  Daily ambulation.  Brace on when she is up.  Back precautions  at all times.  No lifting heavier than 5 pounds.  The patient may shower  when drainage stops completely.  Dressing change daily.   FOLLOWUP:  In two weeks with Dr. Noel Gerold.   MEDICATIONS:  1.  Vicodin as needed for pain.  2.  Robaxin as needed for muscle spasm.  3.  Multivitamin daily.  4.  Calcium daily.  5.  Colace b.i.d.  6.  Resume home medications.  7.  Laxative as needed.  8.  Avoid NSAIDs.   DIET:  Regular home diet.   CONDITION ON DISCHARGE:  Stable, improved.   DISPOSITION:  Patient being discharged to her home with her family's  assistance as well as home health physical therapy and occupational therapy  from [place].      CM/MEDQ  D:  02/09/2005  T:  02/09/2005  Job:  [id number]

## 2011-04-15 NOTE — Discharge Summary (Signed)
Lydia Barber, Lydia Barber              ACCOUNT NO.:  [id number]   MEDICAL RECORD NO.:  [id number]          PATIENT TYPE:  INP   LOCATION:  5019                         FACILITY:  [facility]   PHYSICIAN:  Almedia Balls. Ranell Patrick, M.D. DATE OF BIRTH:  Jul 27, 1929   DATE OF ADMISSION:  05/15/2009  DATE OF DISCHARGE:  05/17/2009                               DISCHARGE SUMMARY   ADMISSION DIAGNOSIS:  Right shoulder end-stage osteoarthritis.   DISCHARGE DIAGNOSIS:  Right shoulder end-stage osteoarthritis, status  post total shoulder arthroplasty.   BRIEF HISTORY:  The patient is a 77 year old female with worsening right  shoulder pain secondary to osteoarthritis.  The patient was elected to  have a right total shoulder arthroplasty by Dr. Malon Kindle.   PROCEDURE:  The patient had a right total shoulder arthroplasty by Dr.  Malon Kindle on May 15, 2009.   ASSISTANT:  Donnie Coffin. Dixon, PA-C   ANESTHESIA:  General anesthesia was used with an interscalene block.   ESTIMATED BLOOD LOSS:  Minimal.   COMPLICATIONS:  None.   HOSPITAL COURSE:  The patient was admitted on May 15, 2009 for the  above-stated procedure which she tolerated well.  After adequate time in  the postanesthesia care unit, she was transferred up to 5000.  On postop  day #1, the patient complained of minimal pain to the right, but was  able to work with physical therapy, underwent pendulums and exercises.  Neurologically, she was intact.  Sling was in place and dressing was  healing well.  On postop day #2, she did have some mild decreased  sensation in regards to her biceps, which she did have neurovascularly,  otherwise she was under control.  Neurovascularly, otherwise she is  intact.  Thus, she was discharged home with close followup once  discharged.   DISCHARGE PLAN:  The patient will be discharged home on May 17, 2009.  Her condition is stable.  Her diet is regular.   The patient has no known drug allergies.   DISCHARGE MEDICATIONS:  1. Robaxin 500 mg p.o. daily.  2. Percocet 5/325 one to two tablets q.4-6 h. p.r.n. pain.  Her other      home medications are not listed currently.  Her condition is      otherwise stable.   FOLLOW UP:  The patient will follow back up with Dr. Malon Kindle in 2  weeks.      Thomas B. Durwin Nora, P.A.      Almedia Balls. Ranell Patrick, M.D.  Electronically Signed    TBD/MEDQ  D:  05/19/2009  T:  05/20/2009  Job:  [id number]

## 2011-04-15 NOTE — H&P (Signed)
Lydia Barber, Lydia Barber              ACCOUNT NO.:  [id number]   MEDICAL RECORD NO.:  [id number]          PATIENT TYPE:  INP   LOCATION:  NA                           FACILITY:  [facility]   PHYSICIAN:  Sharolyn Douglas, M.D.        DATE OF BIRTH:  1929/03/18   DATE OF ADMISSION:  11/24/2004  DATE OF DISCHARGE:                                HISTORY & PHYSICAL   CHIEF COMPLAINT:  Pain in my back and right leg.   PRESENT ILLNESS:  This is a 77 year old lady with continuing and progressive  problems concerning her low back pain with radiation into the right lower  extremity.  She has epidural steroid injections only for short-term  improvement.  She has had anti-inflammatories and now has give-away weakness  in the right lower extremity which she finds to be intolerable.  She has to  support herself by leaning forward with her elbows on the counter, etc, for  her comfort.  She had degenerative lumbar spondylolisthesis, L3-L4, L4-L5,  with stenosis as well.  Due to the fact that her overall lifestyle is  markedly deteriorating, it was felt she would benefit with surgical  intervention and is being admitted for L3 to 5 posterior spinal fusion and  laminectomy with pedicle screws, transforaminal lumbar interbody fusion,  allograft and possible BMP.   The patient has been cleared preoperatively by Dr. Elsworth Soho, her  physician.   ALLERGIES:  She has no medical allergies.   CURRENT MEDICATIONS:  1.  Toprol.  2.  Triamterene.  3.  Hydrochlorothiazide.  4.  Alprazolam 3 times daily.  5.  __________ 75 mcg 1 daily.  6.  Os-Cal.  7.  Osteo Bi-Flex.  8.  Zantac over-the-counter.   PAST MEDICAL HISTORY:  1.  She is being treated for hypertension.  2.  She does have COPD with shortness of breath with exertion.  3.  She had a hysterectomy in 1969.  4.  Partial shoulder replacement on the left in 2004 by Dr. Almedia Balls.      Norris.   FAMILY HISTORY:  Family history is positive for heart  disease in the father.   SOCIAL HISTORY:  The patient is married, retired, smokes a half a pack of  cigarettes per day, has no intake of alcohol products, has 3 children.  Her  caregiver after surgery will be her husband.   REVIEW OF SYSTEMS:  CNS:  No seizure disorder, paralysis or double vision.  RESPIRATORY:  The patient has no productive cough and no hemoptysis, but  does have shortness of breath with exertion consistent with COPD.  CARDIOVASCULAR:  No chest pain, no angina, no orthopnea.  GASTROINTESTINAL:  No nausea, vomiting, melena or bloody stools.  GENITOURINARY:  No discharge,  dysuria or hematuria.  MUSCULOSKELETAL:  Musculoskeletal primarily in  present illness.   PHYSICAL EXAMINATION:  GENERAL:  Alert and cooperative, friendly, somewhat  thin 77 year old white female who moves very carefully during the  examination, prefers a bent-over position or leaning against the counter.  VITAL SIGNS:  Blood pressure 140/86; pulse 74, regular; respirations  12,  unlabored.  HEENT:  Normocephalic.  PERRLA.  EOM intact.  Oropharynx is clear.  CHEST:  Chest clear to auscultation.  No rhonchi.  No rales.  HEART:  Regular rate and rhythm.  No murmurs are heard and the heart sounds  are distant.  ABDOMEN:  Abdomen is soft and nontender.  Liver and spleen not felt.  GENITALIA/RECTAL/PELVIC/BREASTS:  Not done, not pertinent to present  illness.  EXTREMITIES:  The patient has weakness on the right lower extremity  primarily secondary to pain.  Straight leg raise is negative bilaterally.   ADMITTING DIAGNOSIS:  1.  L3-L4, L4-L5 spondylolisthesis.  2.  Hypertension.  3.  Hypothyroidism.  4.  Chronic obstructive pulmonary disease.   PLAN:  The patient will undergo L3-to-4 back fusion, a posterior spinal  fusion with laminectomy with pedicle screws, transforaminal lumbar interbody  fusion with allograft and possible BMP.  Today, the patient is fitted with  an EBI lumbosacral orthosis, which  she will be using postoperatively.  This  history and physical is performed in our office on November 18, 2004.  This  patient is given a temporary handicap sticker application.  Should we have  any medical problems, we will certainly ask Dr. Lupe Carney or one of his  associates to follow along with [place] during this patient's hospitalization.      DLU/MEDQ  D:  11/18/2004  T:  11/19/2004  Job:  [id number]   cc:   L. Lupe Carney, M.D.  [address] [place]  [place] [address]  Fax: [phone]

## 2011-08-26 ENCOUNTER — Emergency Department (HOSPITAL_COMMUNITY): Payer: Medicare Other

## 2011-08-26 ENCOUNTER — Emergency Department (HOSPITAL_COMMUNITY)
Admission: EM | Admit: 2011-08-26 | Discharge: 2011-08-26 | Disposition: A | Discharge status: Home / Self Care | Payer: Medicare Other | Attending: Emergency Medicine | Admitting: Emergency Medicine

## 2011-08-26 DIAGNOSIS — W19XXXA Unspecified fall, initial encounter: Secondary | ICD-10-CM | POA: Insufficient documentation

## 2011-08-26 DIAGNOSIS — I10 Essential (primary) hypertension: Secondary | ICD-10-CM | POA: Insufficient documentation

## 2011-08-26 DIAGNOSIS — Y92009 Unspecified place in unspecified non-institutional (private) residence as the place of occurrence of the external cause: Secondary | ICD-10-CM | POA: Insufficient documentation

## 2011-08-26 DIAGNOSIS — M25579 Pain in unspecified ankle and joints of unspecified foot: Secondary | ICD-10-CM | POA: Insufficient documentation

## 2011-08-26 DIAGNOSIS — E039 Hypothyroidism, unspecified: Secondary | ICD-10-CM | POA: Insufficient documentation

## 2011-08-26 DIAGNOSIS — Z79899 Other long term (current) drug therapy: Secondary | ICD-10-CM | POA: Insufficient documentation

## 2011-08-26 DIAGNOSIS — S52509A Unspecified fracture of the lower end of unspecified radius, initial encounter for closed fracture: Secondary | ICD-10-CM | POA: Insufficient documentation

## 2011-08-26 DIAGNOSIS — Y9301 Activity, walking, marching and hiking: Secondary | ICD-10-CM | POA: Insufficient documentation

## 2011-08-26 LAB — URINALYSIS, ROUTINE W REFLEX MICROSCOPIC
Bilirubin Urine: NEGATIVE
Glucose, UA: NEGATIVE mg/dL
Hgb urine dipstick: NEGATIVE
Ketones, ur: NEGATIVE mg/dL
Protein, ur: NEGATIVE mg/dL
pH: 6 (ref 5.0–8.0)

## 2011-08-26 LAB — DIFFERENTIAL
Eosinophils Absolute: 0.1 10*3/uL (ref 0.0–0.7)
Eosinophils Relative: 1 % (ref 0–5)
Lymphocytes Relative: 29 % (ref 12–46)
Lymphs Abs: 2 10*3/uL (ref 0.7–4.0)
Monocytes Relative: 10 % (ref 3–12)
Neutrophils Relative %: 60 % (ref 43–77)

## 2011-08-26 LAB — CBC
HCT: 39.9 % (ref 36.0–46.0)
MCH: 36.2 pg — ABNORMAL HIGH (ref 26.0–34.0)
MCV: 104.7 fL — ABNORMAL HIGH (ref 78.0–100.0)
Platelets: 270 10*3/uL (ref 150–400)
RBC: 3.81 MIL/uL — ABNORMAL LOW (ref 3.87–5.11)
WBC: 6.9 10*3/uL (ref 4.0–10.5)

## 2011-08-26 LAB — BASIC METABOLIC PANEL
BUN: 25 mg/dL — ABNORMAL HIGH (ref 6–23)
CO2: 23 mEq/L (ref 19–32)
Calcium: 9.5 mg/dL (ref 8.4–10.5)
Chloride: 99 mEq/L (ref 96–112)
Creatinine, Ser: 1.01 mg/dL (ref 0.50–1.10)

## 2011-08-28 LAB — URINE CULTURE
Colony Count: NO GROWTH
Culture  Setup Time: 201209290135
Culture: NO GROWTH

## 2011-09-08 LAB — BASIC METABOLIC PANEL
BUN: 17
Calcium: 9.7
Chloride: 100
Creatinine, Ser: 1.08
GFR calc Af Amer: 60 — ABNORMAL LOW

## 2011-09-08 LAB — URINALYSIS, ROUTINE W REFLEX MICROSCOPIC
Glucose, UA: NEGATIVE
Ketones, ur: NEGATIVE
Protein, ur: NEGATIVE
Urobilinogen, UA: 0.2

## 2011-09-08 LAB — CBC
MCHC: 34.4
MCV: 105.1 — ABNORMAL HIGH
Platelets: 312
WBC: 8

## 2011-09-08 LAB — PROTIME-INR
INR: 0.9
Prothrombin Time: 12.1

## 2012-06-27 ENCOUNTER — Other Ambulatory Visit: Payer: Self-pay | Admitting: Family Medicine

## 2012-07-25 ENCOUNTER — Other Ambulatory Visit: Payer: Self-pay | Admitting: Family Medicine

## 2013-03-05 ENCOUNTER — Other Ambulatory Visit: Payer: Self-pay | Admitting: Family Medicine

## 2013-03-05 ENCOUNTER — Ambulatory Visit
Admission: RE | Admit: 2013-03-05 | Discharge: 2013-03-05 | Disposition: A | Discharge status: Home / Self Care | Payer: Medicare Other | Source: Ambulatory Visit | Attending: Family Medicine | Admitting: Family Medicine

## 2013-03-05 DIAGNOSIS — M25532 Pain in left wrist: Secondary | ICD-10-CM

## 2015-05-14 ENCOUNTER — Ambulatory Visit
Admission: RE | Admit: 2015-05-14 | Discharge: 2015-05-14 | Disposition: A | Discharge status: Home / Self Care | Payer: Medicare Other | Source: Ambulatory Visit | Attending: Family Medicine | Admitting: Family Medicine

## 2015-05-14 ENCOUNTER — Other Ambulatory Visit: Payer: Self-pay | Admitting: Family Medicine

## 2015-05-14 DIAGNOSIS — R52 Pain, unspecified: Secondary | ICD-10-CM

## 2015-06-15 ENCOUNTER — Other Ambulatory Visit: Payer: Self-pay | Admitting: Physician Assistant

## 2015-06-15 ENCOUNTER — Ambulatory Visit
Admission: RE | Admit: 2015-06-15 | Discharge: 2015-06-15 | Disposition: A | Discharge status: Home / Self Care | Payer: Medicare Other | Source: Ambulatory Visit | Attending: Physician Assistant | Admitting: Physician Assistant

## 2015-06-15 DIAGNOSIS — R0602 Shortness of breath: Secondary | ICD-10-CM

## 2016-08-24 ENCOUNTER — Ambulatory Visit
Admission: RE | Admit: 2016-08-24 | Discharge: 2016-08-24 | Disposition: A | Discharge status: Home / Self Care | Payer: Medicare Other | Source: Ambulatory Visit | Attending: Family Medicine | Admitting: Family Medicine

## 2016-08-24 ENCOUNTER — Other Ambulatory Visit: Payer: Self-pay | Admitting: Family Medicine

## 2016-08-24 DIAGNOSIS — R0600 Dyspnea, unspecified: Secondary | ICD-10-CM

## 2017-08-05 ENCOUNTER — Inpatient Hospital Stay (HOSPITAL_COMMUNITY)
Admission: EM | Admit: 2017-08-05 | Discharge: 2017-08-08 | DRG: 176 | Disposition: A | Discharge status: Home Health | Payer: Medicare Other | Attending: Internal Medicine | Admitting: Internal Medicine

## 2017-08-05 ENCOUNTER — Emergency Department (HOSPITAL_COMMUNITY): Payer: Medicare Other

## 2017-08-05 ENCOUNTER — Encounter (HOSPITAL_COMMUNITY): Payer: Self-pay

## 2017-08-05 DIAGNOSIS — R339 Retention of urine, unspecified: Secondary | ICD-10-CM | POA: Diagnosis present

## 2017-08-05 DIAGNOSIS — I10 Essential (primary) hypertension: Secondary | ICD-10-CM | POA: Diagnosis present

## 2017-08-05 DIAGNOSIS — I700 Atherosclerosis of aorta: Secondary | ICD-10-CM | POA: Diagnosis present

## 2017-08-05 DIAGNOSIS — R634 Abnormal weight loss: Secondary | ICD-10-CM | POA: Diagnosis present

## 2017-08-05 DIAGNOSIS — Z7982 Long term (current) use of aspirin: Secondary | ICD-10-CM | POA: Diagnosis not present

## 2017-08-05 DIAGNOSIS — I2699 Other pulmonary embolism without acute cor pulmonale: Secondary | ICD-10-CM | POA: Diagnosis present

## 2017-08-05 DIAGNOSIS — J439 Emphysema, unspecified: Secondary | ICD-10-CM | POA: Diagnosis present

## 2017-08-05 DIAGNOSIS — I2692 Saddle embolus of pulmonary artery without acute cor pulmonale: Secondary | ICD-10-CM | POA: Diagnosis not present

## 2017-08-05 DIAGNOSIS — M899 Disorder of bone, unspecified: Secondary | ICD-10-CM | POA: Diagnosis present

## 2017-08-05 DIAGNOSIS — I248 Other forms of acute ischemic heart disease: Secondary | ICD-10-CM | POA: Diagnosis present

## 2017-08-05 DIAGNOSIS — R778 Other specified abnormalities of plasma proteins: Secondary | ICD-10-CM

## 2017-08-05 DIAGNOSIS — E039 Hypothyroidism, unspecified: Secondary | ICD-10-CM | POA: Diagnosis present

## 2017-08-05 DIAGNOSIS — Z79899 Other long term (current) drug therapy: Secondary | ICD-10-CM | POA: Diagnosis not present

## 2017-08-05 DIAGNOSIS — I2782 Chronic pulmonary embolism: Secondary | ICD-10-CM

## 2017-08-05 DIAGNOSIS — R7989 Other specified abnormal findings of blood chemistry: Secondary | ICD-10-CM

## 2017-08-05 DIAGNOSIS — Z6822 Body mass index (BMI) 22.0-22.9, adult: Secondary | ICD-10-CM | POA: Diagnosis not present

## 2017-08-05 DIAGNOSIS — E871 Hypo-osmolality and hyponatremia: Secondary | ICD-10-CM | POA: Diagnosis present

## 2017-08-05 DIAGNOSIS — R748 Abnormal levels of other serum enzymes: Secondary | ICD-10-CM | POA: Diagnosis present

## 2017-08-05 DIAGNOSIS — E860 Dehydration: Secondary | ICD-10-CM | POA: Diagnosis present

## 2017-08-05 LAB — COMPREHENSIVE METABOLIC PANEL
ALBUMIN: 3.3 g/dL — AB (ref 3.5–5.0)
ALT: 18 U/L (ref 14–54)
AST: 43 U/L — AB (ref 15–41)
Alkaline Phosphatase: 32 U/L — ABNORMAL LOW (ref 38–126)
Anion gap: 16 — ABNORMAL HIGH (ref 5–15)
BILIRUBIN TOTAL: 1.2 mg/dL (ref 0.3–1.2)
BUN: 32 mg/dL — ABNORMAL HIGH (ref 6–20)
CALCIUM: 8.4 mg/dL — AB (ref 8.9–10.3)
CO2: 20 mmol/L — ABNORMAL LOW (ref 22–32)
CREATININE: 1.16 mg/dL — AB (ref 0.44–1.00)
Chloride: 95 mmol/L — ABNORMAL LOW (ref 101–111)
GFR calc Af Amer: 48 mL/min — ABNORMAL LOW (ref >60)
GFR calc non Af Amer: 42 mL/min — ABNORMAL LOW (ref >60)
GLUCOSE: 116 mg/dL — AB (ref 65–99)
POTASSIUM: 3.1 mmol/L — AB (ref 3.5–5.1)
Sodium: 131 mmol/L — ABNORMAL LOW (ref 135–145)
Total Protein: 6.8 g/dL (ref 6.5–8.1)

## 2017-08-05 LAB — CBC WITH DIFFERENTIAL/PLATELET
BASOS ABS: 0 10*3/uL (ref 0.0–0.1)
BASOS PCT: 0 %
Eosinophils Absolute: 0 10*3/uL (ref 0.0–0.7)
Eosinophils Relative: 0 %
HEMATOCRIT: 35.3 % — AB (ref 36.0–46.0)
HEMOGLOBIN: 12.2 g/dL (ref 12.0–15.0)
LYMPHS PCT: 11 %
Lymphs Abs: 1.4 10*3/uL (ref 0.7–4.0)
MCH: 34.9 pg — ABNORMAL HIGH (ref 26.0–34.0)
MCHC: 34.6 g/dL (ref 30.0–36.0)
MCV: 100.9 fL — AB (ref 78.0–100.0)
Monocytes Absolute: 1 10*3/uL (ref 0.1–1.0)
Monocytes Relative: 8 %
NEUTROS ABS: 9.8 10*3/uL — AB (ref 1.7–7.7)
NEUTROS PCT: 81 %
Platelets: 212 10*3/uL (ref 150–400)
RBC: 3.5 MIL/uL — ABNORMAL LOW (ref 3.87–5.11)
RDW: 12.5 % (ref 11.5–15.5)
WBC: 12.2 10*3/uL — ABNORMAL HIGH (ref 4.0–10.5)

## 2017-08-05 LAB — I-STAT CHEM 8, ED
BUN: 30 mg/dL — AB (ref 6–20)
CALCIUM ION: 0.99 mmol/L — AB (ref 1.15–1.40)
CHLORIDE: 96 mmol/L — AB (ref 101–111)
Creatinine, Ser: 1.1 mg/dL — ABNORMAL HIGH (ref 0.44–1.00)
Glucose, Bld: 116 mg/dL — ABNORMAL HIGH (ref 65–99)
HEMATOCRIT: 42 % (ref 36.0–46.0)
Hemoglobin: 14.3 g/dL (ref 12.0–15.0)
Potassium: 3.5 mmol/L (ref 3.5–5.1)
Sodium: 132 mmol/L — ABNORMAL LOW (ref 135–145)
TCO2: 23 mmol/L (ref 22–32)

## 2017-08-05 LAB — TYPE AND SCREEN
ABO/RH(D): A POS
Antibody Screen: NEGATIVE

## 2017-08-05 LAB — URINALYSIS, ROUTINE W REFLEX MICROSCOPIC
Bilirubin Urine: NEGATIVE
Glucose, UA: NEGATIVE mg/dL
Hgb urine dipstick: NEGATIVE
Ketones, ur: 20 mg/dL — AB
Leukocytes, UA: NEGATIVE
Nitrite: NEGATIVE
PROTEIN: 100 mg/dL — AB
Specific Gravity, Urine: 1.026 (ref 1.005–1.030)
pH: 5 (ref 5.0–8.0)

## 2017-08-05 LAB — ABO/RH: ABO/RH(D): A POS

## 2017-08-05 LAB — TROPONIN I
TROPONIN I: 0.09 ng/mL — AB (ref <0.03)
TROPONIN I: 0.12 ng/mL — AB (ref <0.03)

## 2017-08-05 LAB — I-STAT TROPONIN, ED: Troponin i, poc: 0.13 ng/mL (ref 0.00–0.08)

## 2017-08-05 LAB — TSH: TSH: 3.551 u[IU]/mL (ref 0.350–4.500)

## 2017-08-05 LAB — I-STAT CG4 LACTIC ACID, ED
Lactic Acid, Venous: 2.18 mmol/L (ref 0.5–1.9)
Lactic Acid, Venous: 1.27 mmol/L (ref 0.5–1.9)

## 2017-08-05 LAB — PROTIME-INR
INR: 1.09
PROTHROMBIN TIME: 14 s (ref 11.4–15.2)

## 2017-08-05 LAB — HEPARIN LEVEL (UNFRACTIONATED): HEPARIN UNFRACTIONATED: 0.36 [IU]/mL (ref 0.30–0.70)

## 2017-08-05 LAB — APTT: aPTT: 33 seconds (ref 24–36)

## 2017-08-05 MED ORDER — IOPAMIDOL (ISOVUE-370) INJECTION 76%
75.0000 mL | Freq: Once | INTRAVENOUS | Status: AC | PRN
  Administered 2017-08-05: 75 mL via INTRAVENOUS

## 2017-08-05 MED ORDER — DEXTROSE 5 % IV SOLN
1.0000 g | Freq: Once | INTRAVENOUS | Status: AC
  Administered 2017-08-05: 1 g via INTRAVENOUS
  Filled 2017-08-05: qty 10

## 2017-08-05 MED ORDER — POTASSIUM CHLORIDE IN NACL 20-0.9 MEQ/L-% IV SOLN
INTRAVENOUS | Status: DC
  Administered 2017-08-05 – 2017-08-06 (×2): via INTRAVENOUS
  Filled 2017-08-05 (×3): qty 1000

## 2017-08-05 MED ORDER — DEXTROSE 5 % IV SOLN
500.0000 mg | INTRAVENOUS | Status: DC
  Filled 2017-08-05: qty 500

## 2017-08-05 MED ORDER — SODIUM CHLORIDE 0.9 % IV BOLUS (SEPSIS)
1000.0000 mL | Freq: Once | INTRAVENOUS | Status: AC
  Administered 2017-08-05: 1000 mL via INTRAVENOUS

## 2017-08-05 MED ORDER — HEPARIN BOLUS VIA INFUSION
2000.0000 [IU] | Freq: Once | INTRAVENOUS | Status: AC
  Administered 2017-08-05: 2000 [IU] via INTRAVENOUS
  Filled 2017-08-05: qty 2000

## 2017-08-05 MED ORDER — VERAPAMIL HCL 40 MG PO TABS
40.0000 mg | ORAL_TABLET | Freq: Three times a day (TID) | ORAL | Status: DC
  Administered 2017-08-05 – 2017-08-08 (×10): 40 mg via ORAL
  Filled 2017-08-05 (×10): qty 1

## 2017-08-05 MED ORDER — COUMADIN BOOK
Freq: Once | Status: AC
Start: 2017-08-05 — End: 2017-08-05
  Administered 2017-08-05: 18:00:00
  Filled 2017-08-05: qty 1

## 2017-08-05 MED ORDER — ALBUTEROL SULFATE (2.5 MG/3ML) 0.083% IN NEBU: 5.0000 mg | INHALATION_SOLUTION | Freq: Once | RESPIRATORY_TRACT | Status: DC

## 2017-08-05 MED ORDER — ENSURE ENLIVE PO LIQD
237.0000 mL | Freq: Two times a day (BID) | ORAL | Status: DC
  Administered 2017-08-05 – 2017-08-08 (×4): 237 mL via ORAL

## 2017-08-05 MED ORDER — DEXTROSE 5 % IV SOLN
1.0000 g | INTRAVENOUS | Status: DC
  Filled 2017-08-05: qty 10

## 2017-08-05 MED ORDER — VERAPAMIL HCL ER 180 MG PO TBCR: 180.0000 mg | EXTENDED_RELEASE_TABLET | Freq: Two times a day (BID) | ORAL | Status: DC

## 2017-08-05 MED ORDER — WARFARIN SODIUM 3 MG PO TABS
3.0000 mg | ORAL_TABLET | Freq: Once | ORAL | Status: AC
  Administered 2017-08-05: 3 mg via ORAL
  Filled 2017-08-05: qty 1

## 2017-08-05 MED ORDER — CITALOPRAM HYDROBROMIDE 10 MG PO TABS
20.0000 mg | ORAL_TABLET | Freq: Every day | ORAL | Status: DC
  Administered 2017-08-05 – 2017-08-08 (×4): 20 mg via ORAL
  Filled 2017-08-05 (×4): qty 2

## 2017-08-05 MED ORDER — LEVOTHYROXINE SODIUM 75 MCG PO TABS
75.0000 ug | ORAL_TABLET | Freq: Every day | ORAL | Status: DC
  Administered 2017-08-06 – 2017-08-08 (×3): 75 ug via ORAL
  Filled 2017-08-05 (×3): qty 1

## 2017-08-05 MED ORDER — HEPARIN (PORCINE) IN NACL 100-0.45 UNIT/ML-% IJ SOLN
800.0000 [IU]/h | INTRAMUSCULAR | Status: DC
  Administered 2017-08-05 – 2017-08-06 (×2): 800 [IU]/h via INTRAVENOUS
  Filled 2017-08-05 (×2): qty 250

## 2017-08-05 MED ORDER — DEXTROSE 5 % IV SOLN
500.0000 mg | Freq: Once | INTRAVENOUS | Status: AC
  Administered 2017-08-05: 500 mg via INTRAVENOUS
  Filled 2017-08-05: qty 500

## 2017-08-05 MED ORDER — DOCUSATE SODIUM 100 MG PO CAPS: 100.0000 mg | ORAL_CAPSULE | Freq: Every day | ORAL | Status: DC | PRN

## 2017-08-05 MED ORDER — IOPAMIDOL (ISOVUE-370) INJECTION 76%
INTRAVENOUS | Status: AC
  Filled 2017-08-05: qty 100

## 2017-08-05 MED ORDER — WARFARIN - PHARMACIST DOSING INPATIENT: Freq: Every day | Status: DC

## 2017-08-05 MED ORDER — WARFARIN VIDEO
Freq: Once | Status: AC
  Administered 2017-08-05: 1

## 2017-08-05 NOTE — ED Notes (Signed)
She remains awake, alert and oriented x 4 with clear speech. She is visiting pleasantly with her family.

## 2017-08-05 NOTE — ED Notes (Signed)
I have just phoned report to [place], RN in [address]; and will transport shortly.

## 2017-08-05 NOTE — Progress Notes (Signed)
ANTICOAGULATION CONSULT NOTE - Initial Consult  Pharmacy Consult for IV heparin Indication: PE  Not on File  Patient Measurements: Height: 5' (152.4 cm) Weight: 112 lb (50.8 kg) IBW/kg (Calculated) : 45.5 Heparin Dosing Weight: using recorded total body weight of 50.8 kg  Vital Signs: Temp: 97.5 F (36.4 C) (09/08 1030) Temp Source: Rectal (09/08 1030) BP: 106/58 (09/08 1333) Pulse Rate: 84 (09/08 1333)  Labs:  Recent Labs  08/05/17 1103 08/05/17 1116  HGB 12.2 14.3  HCT 35.3* 42.0  PLT 212  --   CREATININE 1.16* 1.10*    Estimated Creatinine Clearance: 26.9 mL/min (A) (by C-G formula based on SCr of 1.1 mg/dL (H)).   Medical History: History reviewed. No pertinent past medical history.   Assessment: [age] yoF presents with shortness of breath. CT chest shows multiple right side pulmonary emboli with no evidence of associated right heart strain.  Pharmacy consulted to start heparin infusion.  On aspirin 81 mg daily PTA. Not on any anticoagulants PTA.  Baseline Hgb and platelets WNL.  Baseline anticoag labs ordered STAT.  Goal of Therapy:  Heparin level 0.3-0.7 units/ml Monitor platelets by anticoagulation protocol: Yes   Plan:  Heparin 2000 unit bolus then start infusion at 800 ml/hr. Check heparin level in 8 hours. Daily heparin level and CBC while on heparin infusion.  Clance Bollunyon, Jerrye Seebeck 08/05/2017,1:42 PM

## 2017-08-05 NOTE — ED Notes (Signed)
RN AND MD NOTIFIED OF PATIENT'S TROPONIN LEVEL OF 0.13

## 2017-08-05 NOTE — ED Notes (Signed)
[facility] [phone]

## 2017-08-05 NOTE — H&P (Addendum)
History and Physical    CRESCENT GOTHAM ZOX:[id number] DOB: 1929-04-18 DOA: 08/05/2017  PCP: Clovis Riley, L.August Saucer, MD  Patient coming from:   I have personally briefly reviewed patient's old medical records in [facility]  Chief Complaint:   HPI: Lydia Barber is a 83 y.o. female with past medical history only significant for hypertension and hypothyroidism. Patient is admitted with increasing weakness shortness of breath and weight loss she lost 2.5 pound weight in the last 1 week. She denies any chest pain nausea vomiting or diarrhea. She was treated with ciprofloxacin for the possible urinary tract infection but the urine culture came back normal. There is no history of any kind of malignancy in her own and her family. She denies any urinary complaints at this time her appetite has been diminished. She lives at home with her husband. According to the patient and the family she has had appropriate follow-ups with her primary care physician along with mammogram Pap smear etc. ED Course: In the emergency room her first sodium was 131 and the repeated sodium was 132 potassium was 3.1 which came up to 3.5 BUN is 30 creatinine is 1.10. She was started on IV heparin for possible pulmonary embolism in the left lung along with multiple pulmonary emboli with segmental pulmonary artery branches to the right lower lobe and right middle lobe. Pulmonary embolus was seen in the left pulmonary artery no left-sided PE was noted evidence of right heart strain was noted multiple sclerotic lesions of the left-sided ribs which was thought to be secondary to cancer.  Review of Systems: As per HPI otherwise 10 point review of systems negative.    History reviewed. No pertinent past medical history.  History reviewed. No pertinent surgical history.   has no tobacco, alcohol, and drug history on file.  Allergies  Allergen Reactions  . Codeine Other (See Comments)    Pt does not remember what happens     No family history on file.    Prior to Admission medications   Medication Sig Start Date End Date Taking? Authorizing Provider  acetaminophen (TYLENOL) 500 MG tablet Take 1,000 mg by mouth 2 (two) times daily as needed.   Yes [provider]  aspirin 81 MG tablet Take 81 mg by mouth daily.   Yes [provider]  Calcium 600-400 MG-UNIT CHEW Chew 1 tablet by mouth daily.   Yes [provider]  citalopram (CELEXA) 20 MG tablet Take 20 mg by mouth daily. 07/05/17  Yes [provider]  docusate sodium (COLACE) 100 MG capsule Take 100 mg by mouth daily as needed for mild constipation.   Yes [provider]  Misc Natural Products (OSTEO BI-FLEX ADV JOINT SHIELD) TABS Take 1 tablet by mouth daily.   Yes [provider]  Multiple Vitamin (MULTIVITAMIN) tablet Take 1 tablet by mouth daily.   Yes [provider]  ranitidine (ZANTAC) 75 MG tablet Take 75 mg by mouth 2 (two) times daily.   Yes [provider]  SYNTHROID 75 MCG tablet Take 75 mcg by mouth daily. 07/05/17  Yes [provider]  verapamil (CALAN-SR) 180 MG CR tablet Take 180 mg by mouth 2 times daily at 12 noon and 4 pm. 07/05/17  Yes [provider]    Physical Exam: Vitals:   08/05/17 1230 08/05/17 1333 08/05/17 1333 08/05/17 1506  BP: 115/69 (!) 106/58  121/65  Pulse: 77 84  92  Resp: Temp:  98.8 F (37.1 C)  TempSrc:    Axillary  SpO2:  99%  96%  Weight:   50.8 kg (112 lb) 51.8 kg (114 lb 3.2 oz)  Height:   5' (1.524 m) 5' (1.524 m)    Constitutional: NAD, calm, comfortable Vitals:   08/05/17 1230 08/05/17 1333 08/05/17 1333 08/05/17 1506  BP: 115/69 (!) 106/58  121/65  Pulse: 77 84  92  Resp: 15 18  18   Temp:    98.8 F (37.1 C)  TempSrc:    Axillary  SpO2:  99%  96%  Weight:   50.8 kg (112 lb) 51.8 kg (114 lb 3.2 oz)  Height:   5' (1.524 m) 5' (1.524 m)   Eyes: PERRL, lids and conjunctivae normal ENMT: Mucous  membranes are moist. Posterior pharynx clear of any exudate or lesions.Normal dentition.  Neck: normal, supple, no masses, no thyromegaly Respiratory: clear to auscultation bilaterally, no wheezing, no crackles. Normal respiratory effort. No accessory muscle use.  Cardiovascular: Regular rate and rhythm, no murmurs / rubs / gallops. No extremity edema. 2+ pedal pulses. No carotid bruits.  Abdomen: no tenderness, no masses palpated. No hepatosplenomegaly. Bowel sounds positive.  Musculoskeletal: no clubbing / cyanosis. No joint deformity upper and lower extremities. Good ROM, no contractures. Normal muscle tone.  Skin: no rashes, lesions, ulcers. No induration Neurologic: CN 2-12 grossly intact. Sensation intact, DTR normal. Strength 5/5 in all 4.  Psychiatric: Normal judgment and insight. Alert and oriented x 3. Normal mood.     Labs on Admission: I have personally reviewed following labs and imaging studies  CBC:  Recent Labs Lab 08/05/17 1103 08/05/17 1116  WBC 12.2*  --   NEUTROABS 9.8*  --   HGB 12.2 14.3  HCT 35.3* 42.0  MCV 100.9*  --   PLT 212  --    Basic Metabolic Panel:  Recent Labs Lab 08/05/17 1103 08/05/17 1116  NA 131* 132*  K 3.1* 3.5  CL 95* 96*  CO2 20*  --   GLUCOSE 116* 116*  BUN 32* 30*  CREATININE 1.16* 1.10*  CALCIUM 8.4*  --    GFR: Estimated Creatinine Clearance: 26.9 mL/min (A) (by C-G formula based on SCr of 1.1 mg/dL (H)). Liver Function Tests:  Recent Labs Lab 08/05/17 1103  AST 43*  ALT 18  ALKPHOS 32*  BILITOT 1.2  PROT 6.8  ALBUMIN 3.3*   No results for input(s): LIPASE, AMYLASE in the last 168 hours. No results for input(s): AMMONIA in the last 168 hours. Coagulation Profile:  Recent Labs Lab 08/05/17 1106  INR 1.09   Cardiac Enzymes: No results for input(s): CKTOTAL, CKMB, CKMBINDEX, TROPONINI in the last 168 hours. BNP (last 3 results) No results for input(s): PROBNP in the last 8760 hours. HbA1C: No results for  input(s): HGBA1C in the last 72 hours. CBG: No results for input(s): GLUCAP in the last 168 hours. Lipid Profile: No results for input(s): CHOL, HDL, LDLCALC, TRIG, CHOLHDL, LDLDIRECT in the last 72 hours. Thyroid Function Tests:  Recent Labs  08/05/17 1106  TSH 3.551   Anemia Panel: No results for input(s): VITAMINB12, FOLATE, FERRITIN, TIBC, IRON, RETICCTPCT in the last 72 hours. Urine analysis:    Component Value Date/Time   COLORURINE AMBER (A) 08/05/2017 1144   APPEARANCEUR HAZY (A) 08/05/2017 1144   LABSPEC 1.026 08/05/2017 1144   PHURINE 5.0 08/05/2017 1144   GLUCOSEU NEGATIVE 08/05/2017 1144   HGBUR NEGATIVE 08/05/2017 1144   BILIRUBINUR NEGATIVE 08/05/2017 1144  KETONESUR 20 (A) 08/05/2017 1144   PROTEINUR 100 (A) 08/05/2017 1144   UROBILINOGEN 0.2 08/26/2011 2109   NITRITE NEGATIVE 08/05/2017 1144   LEUKOCYTESUR NEGATIVE 08/05/2017 1144    Radiological Exams on Admission: Dg Chest 2 View  Result Date: 08/05/2017 CLINICAL DATA:  83 year old female with shortness of breath for the past 2 weeks EXAM: CHEST  2 VIEW COMPARISON:  Prior chest x-ray 08/24/2016 FINDINGS: Stable cardiac and mediastinal contours. Trace atherosclerotic calcification is present in the transverse aorta. An epidural spinal stimulator overlie is the mediastinal shadows. New nodular opacity in the periphery of the left lung base demonstrates an internal air bronchogram suggesting a small focus of infection/inflammation. The lungs are slightly hyperinflated and hyperlucent. No pulmonary edema, pleural effusion or pneumothorax. No acute osseous abnormality. Surgical changes of prior bilateral shoulder joint arthroplasties. IMPRESSION: 1. Patchy nodular airspace opacity in the periphery of the left lower lobe appears to contain an air bronchograms centrally. This favors a small focus of active infection/ inflammation. However, a true pulmonary nodule is difficult to exclude entirely. Followup PA and lateral  chest X-ray is recommended in 3-4 weeks following trial of antibiotic therapy to ensure resolution and exclude underlying malignancy. 2.  Aortic Atherosclerosis (ICD10-170.0) Electronically Signed   By: Malachy Moan M.D.   On: 08/05/2017 10:47   Ct Angio Chest Pe W And/or Wo Contrast  Result Date: 08/05/2017 CLINICAL DATA:  APPROX. 2 week hx of weakness and anorexia, and a general feeling of unwellness/disquiet with some wt. Loss. Pt. Continues to feel unwell, coupled with shortness of breath. She is pale and moderately short of breath upon arrival. EXAM: CT ANGIOGRAPHY CHEST WITH CONTRAST TECHNIQUE: Multidetector CT imaging of the chest was performed using the standard protocol during bolus administration of intravenous contrast. Multiplanar CT image reconstructions and MIPs were obtained to evaluate the vascular anatomy. CONTRAST:  75 cc Isovue 370 COMPARISON:  Chest x-ray from earlier same day. FINDINGS: Cardiovascular: Nearly occlusive pulmonary embolism is seen within the central segmental pulmonary artery branches to the posterior segment of the right lower lobe (series 7, image 192). Additional smaller nonocclusive pulmonary emboli are seen within the segmental pulmonary artery branches to the right middle lobe and right lower lobe. No left-sided pulmonary emboli identified. Heart size is upper normal. No pericardial effusion. Aortic atherosclerosis. No aortic aneurysm or dissection seen. Mediastinum/Nodes: No mass or enlarged lymph nodes seen within the mediastinum or perihilar regions. Esophagus appears normal. Trachea and central bronchi are unremarkable. Lungs/Pleura: Emphysematous changes within the lung apices, mild to moderate in degree. Additional blebs along the posterior aspects of the right lower lobe and at the left lung base. No consolidation or pleural effusion. No pulmonary nodule or mass identified. No pneumothorax. Upper Abdomen: Limited images of the upper abdomen are unremarkable.  Small hypodense lesions are seen within the upper pole of the left kidney, incompletely imaged, best seen lesion compatible with a benign cyst by CT density measurement. 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 Mixed lytic and sclerotic lesion is seen within the adjacent left lateral ninth rib (series 5, image 86). There are additional sclerotic lesions within the left posterior eleventh rib, left anterior-lateral eighth rib and left anterior fourth rib. No convincing lesion identified within the right ribs. Displaced/impacted fracture of the sternum appears chronic, with surrounding sclerotic change. Review of the MIP images confirms the above findings. IMPRESSION: 1. Multiple pulmonary emboli within segmental pulmonary artery branches  to the right lower lobe and right middle lobe. Additional small nonocclusive pulmonary embolus within the right interlobar pulmonary artery. No left-sided pulmonary embolus identified. No evidence of associated right heart strain identified. 2. Multiple sclerotic lesions within the left-sided ribs, as detailed above. There is a slightly displaced fracture of the posterior left tenth rib, likely subacute to chronic in age with nonunion, possibly a pathologic fracture, corresponding to the earlier chest x-ray finding. Additional suspicious lesions are seen within the left lateral ninth rib, left anterior- lateral eighth rib and left anterior fourth rib. If no known primary cancer, consider PET-CT for further characterization. 3. Emphysematous change, upper lobe predominant, mild to moderate in degree. 4. No pneumonia, pleural effusion or pulmonary mass. Aortic Atherosclerosis (ICD10-I70.0) and Emphysema (ICD10-J43.9). These results were called by telephone at the time of interpretation on 08/05/2017 at 12:56 pm to Dr. Chaney Malling , who verbally acknowledged these results. Electronically Signed   By: Bary Richard M.D.   On: 08/05/2017 13:00    EKG: Independently reviewed.   Assessment/Plan Active Problems:   Pulmonary emboli (HCC)   Multiple PE nearly occlusive pulmonary embolism in the central segmental pulmonary artery branches to the right lower lobe. Patient is started on heparin in the ER. I will also add Coumadin. A hypercoagulable workup was not sent in the ER as heparin was started prior to me seeing her. Patient will need at consulting with an oncologist hematology oncologist on Monday and follow-up with him as an outpatient.  Sclerotic lesions in the left ribs possible pathological fracture. The source of her primary care neck cancer is unknown at this time will continue with anticoagulation obtain a CT scan of the abdomen and pelvis to see if there is any evidence of malignancy or metastases stasis. Please consult oncology on [facility] for further evaluation and follow-upay for further evaluation and follow-up..   Mild hyponatremia with some dehydration - normal saline ivf.  Htn patient was taking metoprolol Cardizem and add Diuril take at home her primary care physician stopped the metoprolol and the Diuretics take as patient was more on the dry side and did not have any evidence of edema at her blood pressure was also on the low to low normal side. At this time I will continue her Cardizem at a lower dose 2-3 times a day.   Possible pneumonia with elevated lactate level upon admission 2.18 came down to 1.27 after IV antibiotics and IV fluids along with elevated white count of 12.2. Patient has been started on Rocephin and azithromycin which will be continued.   mildly elevated troponin with 0.13 with no acute EKG changes probably secondary to right heart strain from the pulmonary embolism. Follow-up troponin levels and EKG.   DVT prophylaxis: heparin Code Statusfull Family Communicationdaughter and husband Disposition Plan:  Consults called Admission status:   Alwyn Ren  MD [facility]  If 7PM-7AM, please contact night-coverage [email or web address] Password [email or web address]  08/05/2017, 3:15 PM

## 2017-08-05 NOTE — Progress Notes (Signed)
CRITICAL VALUE ALERT  Critical Value:  Troponin 0.09  Date & Time Notied:  1630  Provider Notified: Towana BadgerE. Matthews  Orders Received/Actions taken:

## 2017-08-05 NOTE — Progress Notes (Addendum)
ANTICOAGULATION CONSULT NOTE - Follow-Up Consult  Pharmacy Consult for IV heparin + warfarin Indication: PE  Allergies  Allergen Reactions  . Codeine Other (See Comments)    Pt does not remember what happens    Patient Measurements: Height: 5' (152.4 cm) Weight: 114 lb 3.2 oz (51.8 kg) IBW/kg (Calculated) : 45.5 Heparin Dosing Weight: Total body weight  Vital Signs: Temp: 98.8 F (37.1 C) (09/08 1506) Temp Source: Axillary (09/08 1506) BP: 121/65 (09/08 1506) Pulse Rate: 92 (09/08 1506)  Labs:  Recent Labs  08/05/17 1103 08/05/17 1106 08/05/17 1116  HGB 12.2  --  14.3  HCT 35.3*  --  42.0  PLT 212  --   --   APTT  --  33  --   LABPROT  --  14.0  --   INR  --  1.09  --   CREATININE 1.16*  --  1.10*    Estimated Creatinine Clearance: 26.9 mL/min (A) (by C-G formula based on SCr of 1.1 mg/dL (H)).   Medical History: History reviewed. No pertinent past medical history.   Assessment: [age] yoF presents with shortness of breath. CT chest shows multiple right side pulmonary emboli. Pharmacy consulted to start heparin infusion.  On aspirin 81 mg daily PTA. Not on any anticoagulants PTA.  Baseline Hgb and platelets WNL.  Baseline aPTT, PT/INR WNL. Pharmacy now consulted for warfarin dosing as well. Major drug interactions include antibiotics and levothyroxine.   Goal of Therapy:  INR 2-3 Heparin level 0.3-0.7 units/ml Monitor platelets by anticoagulation protocol: Yes   Plan:   Continue heparin infusion at 800 units/hr.  Check heparin level 8 hours after start of infusion.  Daily heparin level and CBC while on heparin infusion.  Warfarin 3 mg PO x 1 today.  Daily PT/INR  Needs minimum of 5 days overlap between parenteral anticoagulant + warfarin and therapeutic INR x 2 prior to discontinuation of parenteral anticoagulant.   Provide education prior to discharge.    Greer PickerelJigna Lacara Dunsworth, PharmD, BCPS Pager: [phone] 08/05/2017 4:10 PM

## 2017-08-05 NOTE — ED Provider Notes (Signed)
[facility]-EMERGENCY DEPT Provider Note   CSN: [id number] Arrival date & time: 08/05/17  4098     History   Chief Complaint Chief Complaint  Patient presents with  . Weakness  . Shortness of Breath    HPI Lydia Barber is a 83 y.o. female history of hypertension, hypothyroidism here presenting with weakness, shortness of breath. Patient states that she's been having shortness of breath with minimal exertion for the last 2 weeks. She went to see her doctor about 2 weeks ago and was diagnosed with urinary tract infection and finished a course of ciprofloxacin. Old up with her doctor about a week ago and the urine culture was negative but she still feels short of breath. She states that it has progressively got worse to the point that she has shortness of breath with minimal exertion and requires people to assist her to walk. She has been losing weight due to poor appetite as well but denies any vomiting. Family noticed that she appears very pale and dehydrated.   The history is provided by the patient.    History reviewed. No pertinent past medical history.  There are no active problems to display for this patient.   History reviewed. No pertinent surgical history.  OB History    No data available       Home Medications    Prior to Admission medications   Medication Sig Start Date End Date Taking? Authorizing Provider  acetaminophen (TYLENOL) 500 MG tablet Take 1,000 mg by mouth 2 (two) times daily as needed.   Yes [provider]  aspirin 81 MG tablet Take 81 mg by mouth daily.   Yes [provider]  Calcium 600-400 MG-UNIT CHEW Chew 1 tablet by mouth daily.   Yes [provider]  citalopram (CELEXA) 20 MG tablet Take 20 mg by mouth daily. 07/05/17  Yes [provider]  docusate sodium (COLACE) 100 MG capsule Take 100 mg by mouth daily as needed for mild constipation.   Yes [provider]  Misc Natural Products (OSTEO BI-FLEX ADV  JOINT SHIELD) TABS Take 1 tablet by mouth daily.   Yes [provider]  Multiple Vitamin (MULTIVITAMIN) tablet Take 1 tablet by mouth daily.   Yes [provider]  ranitidine (ZANTAC) 75 MG tablet Take 75 mg by mouth 2 (two) times daily.   Yes [provider]  SYNTHROID 75 MCG tablet Take 75 mcg by mouth daily. 07/05/17  Yes [provider]  verapamil (CALAN-SR) 180 MG CR tablet Take 180 mg by mouth 2 times daily at 12 noon and 4 pm. 07/05/17  Yes [provider]    Family History No family history on file.  Social History Social History  Substance Use Topics  . Smoking status: Not on file  . Smokeless tobacco: Not on file  . Alcohol use Not on file     Allergies   Patient has no allergy information on record.   Review of Systems Review of Systems  Respiratory: Positive for shortness of breath.   Neurological: Positive for weakness.  All other systems reviewed and are negative.    Physical Exam Updated Vital Signs BP 115/69   Pulse 77   Temp (!) 97.5 F (36.4 C) (Rectal)   Resp 15   SpO2 99%   Physical Exam  Constitutional: She is oriented to person, place, and time.  Chronically ill, dehydrated   HENT:  Head: Normocephalic.  MM dry   Eyes: Pupils are equal, round,  and reactive to light. Conjunctivae and EOM are normal.  Neck: Normal range of motion. Neck supple.  Cardiovascular: Normal rate, regular rhythm and normal heart sounds.   Pulmonary/Chest:  Crackles L base   Abdominal: Soft. Bowel sounds are normal. She exhibits no distension. There is no tenderness. There is no guarding.  Musculoskeletal: Normal range of motion. She exhibits no edema.  Neurological: She is alert and oriented to person, place, and time.  Skin: Skin is warm.  Psychiatric: She has a normal mood and affect.  Nursing note and vitals reviewed.    ED Treatments / Results  Labs (all labs ordered are listed, but only abnormal results are  displayed) Labs Reviewed  CBC WITH DIFFERENTIAL/PLATELET - Abnormal; Notable for the following:       Result Value   WBC 12.2 (*)    RBC 3.50 (*)    HCT 35.3 (*)    MCV 100.9 (*)    MCH 34.9 (*)    Neutro Abs 9.8 (*)    All other components within normal limits  COMPREHENSIVE METABOLIC PANEL - Abnormal; Notable for the following:    Sodium 131 (*)    Potassium 3.1 (*)    Chloride 95 (*)    CO2 20 (*)    Glucose, Bld 116 (*)    BUN 32 (*)    Creatinine, Ser 1.16 (*)    Calcium 8.4 (*)    Albumin 3.3 (*)    AST 43 (*)    Alkaline Phosphatase 32 (*)    GFR calc non Af Amer 42 (*)    GFR calc Af Amer 48 (*)    Anion gap 16 (*)    All other components within normal limits  URINALYSIS, ROUTINE W REFLEX MICROSCOPIC - Abnormal; Notable for the following:    Color, Urine AMBER (*)    APPearance HAZY (*)    Ketones, ur 20 (*)    Protein, ur 100 (*)    Bacteria, UA RARE (*)    Squamous Epithelial / LPF 0-5 (*)    All other components within normal limits  I-STAT TROPONIN, ED - Abnormal; Notable for the following:    Troponin i, poc 0.13 (*)    All other components within normal limits  I-STAT CG4 LACTIC ACID, ED - Abnormal; Notable for the following:    Lactic Acid, Venous 2.18 (*)    All other components within normal limits  I-STAT CHEM 8, ED - Abnormal; Notable for the following:    Sodium 132 (*)    Chloride 96 (*)    BUN 30 (*)    Creatinine, Ser 1.10 (*)    Glucose, Bld 116 (*)    Calcium, Ion 0.99 (*)    All other components within normal limits  URINE CULTURE  CULTURE, BLOOD (ROUTINE X 2)  CULTURE, BLOOD (ROUTINE X 2)  TSH  PROTIME-INR  APTT  TYPE AND SCREEN  ABO/RH    EKG  EKG Interpretation  Date/Time:  Saturday August 05 2017 10:07:45 EDT Ventricular Rate:  85 PR Interval:    QRS Duration: 82 QT Interval:  404 QTC Calculation: 481 R Axis:   -20 Text Interpretation:  Sinus rhythm Borderline left axis deviation RSR' in V1 or V2, probably normal  variant Borderline T abnormalities, anterior leads No significant change since last tracing Confirmed by Richardean CanalYao, David H ([id number]) on 08/05/2017 11:28:06 AM       Radiology Dg Chest 2 View  Result Date: 08/05/2017 CLINICAL DATA:  83 year old  female with shortness of breath for the past 2 weeks EXAM: CHEST  2 VIEW COMPARISON:  Prior chest x-ray 08/24/2016 FINDINGS: Stable cardiac and mediastinal contours. Trace atherosclerotic calcification is present in the transverse aorta. An epidural spinal stimulator overlie is the mediastinal shadows. New nodular opacity in the periphery of the left lung base demonstrates an internal air bronchogram suggesting a small focus of infection/inflammation. The lungs are slightly hyperinflated and hyperlucent. No pulmonary edema, pleural effusion or pneumothorax. No acute osseous abnormality. Surgical changes of prior bilateral shoulder joint arthroplasties. IMPRESSION: 1. Patchy nodular airspace opacity in the periphery of the left lower lobe appears to contain an air bronchograms centrally. This favors a small focus of active infection/ inflammation. However, a true pulmonary nodule is difficult to exclude entirely. Followup PA and lateral chest X-ray is recommended in 3-4 weeks following trial of antibiotic therapy to ensure resolution and exclude underlying malignancy. 2.  Aortic Atherosclerosis (ICD10-170.0) Electronically Signed   By: Malachy Moan M.D.   On: 08/05/2017 10:47   Ct Angio Chest Pe W And/or Wo Contrast  Result Date: 08/05/2017 CLINICAL DATA:  APPROX. 2 week hx of weakness and anorexia, and a general feeling of unwellness/disquiet with some wt. Loss. Pt. Continues to feel unwell, coupled with shortness of breath. She is pale and moderately short of breath upon arrival. EXAM: CT ANGIOGRAPHY CHEST WITH CONTRAST TECHNIQUE: Multidetector CT imaging of the chest was performed using the standard protocol during bolus administration of intravenous contrast.  Multiplanar CT image reconstructions and MIPs were obtained to evaluate the vascular anatomy. CONTRAST:  75 cc Isovue 370 COMPARISON:  Chest x-ray from earlier same day. FINDINGS: Cardiovascular: Nearly occlusive pulmonary embolism is seen within the central segmental pulmonary artery branches to the posterior segment of the right lower lobe (series 7, image 192). Additional smaller nonocclusive pulmonary emboli are seen within the segmental pulmonary artery branches to the right middle lobe and right lower lobe. No left-sided pulmonary emboli identified. Heart size is upper normal. No pericardial effusion. Aortic atherosclerosis. No aortic aneurysm or dissection seen. Mediastinum/Nodes: No mass or enlarged lymph nodes seen within the mediastinum or perihilar regions. Esophagus appears normal. Trachea and central bronchi are unremarkable. Lungs/Pleura: Emphysematous changes within the lung apices, mild to moderate in degree. Additional blebs along the posterior aspects of the right lower lobe and at the left lung base. No consolidation or pleural effusion. No pulmonary nodule or mass identified. No pneumothorax. Upper Abdomen: Limited images of the upper abdomen are unremarkable. Small hypodense lesions are seen within the upper pole of the left kidney, incompletely imaged, best seen lesion compatible with a benign cyst by CT density measurement. 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 Mixed lytic and sclerotic lesion is seen within the adjacent left lateral ninth rib (series 5, image 86). There are additional sclerotic lesions within the left posterior eleventh rib, left anterior-lateral eighth rib and left anterior fourth rib. No convincing lesion identified within the right ribs. Displaced/impacted fracture of the sternum appears chronic, with surrounding sclerotic change. Review of the MIP images confirms the above findings.  IMPRESSION: 1. Multiple pulmonary emboli within segmental pulmonary artery branches to the right lower lobe and right middle lobe. Additional small nonocclusive pulmonary embolus within the right interlobar pulmonary artery. No left-sided pulmonary embolus identified. No evidence of associated right heart strain identified. 2. Multiple sclerotic lesions within the left-sided ribs, as detailed above. 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 Additional suspicious lesions are seen within the left lateral ninth rib, left anterior- lateral eighth rib and left anterior fourth rib. If no known primary cancer, consider PET-CT for further characterization. 3. Emphysematous change, upper lobe predominant, mild to moderate in degree. 4. No pneumonia, pleural effusion or pulmonary mass. Aortic Atherosclerosis (ICD10-I70.0) and Emphysema (ICD10-J43.9). These results were called by telephone at the time of interpretation on 08/05/2017 at 12:56 pm to Dr. Chaney Malling , who verbally acknowledged these results. Electronically Signed   By: Bary Richard M.D.   On: 08/05/2017 13:00    Procedures Procedures (including critical care time)  CRITICAL CARE Performed by: Richardean Canal   Total critical care time: 30 minutes  Critical care time was exclusive of separately billable procedures and treating other patients.  Critical care was necessary to treat or prevent imminent or life-threatening deterioration.  Critical care was time spent personally by me on the following activities: development of treatment plan with patient and/or surrogate as well as nursing, discussions with consultants, evaluation of patient's response to treatment, examination of patient, obtaining history from patient or surrogate, ordering and performing treatments and interventions, ordering and  review of laboratory studies, ordering and review of radiographic studies, pulse oximetry and re-evaluation of patient's condition.    Medications Ordered in ED Medications  azithromycin (ZITHROMAX) 500 mg in dextrose 5 % 250 mL IVPB (500 mg Intravenous New Bag/Given 08/05/17 1220)  iopamidol (ISOVUE-370) 76 % injection (not administered)  sodium chloride 0.9 % bolus 1,000 mL (0 mLs Intravenous Stopped 08/05/17 1220)  cefTRIAXone (ROCEPHIN) 1 g in dextrose 5 % 50 mL IVPB (0 g Intravenous Stopped 08/05/17 1220)  iopamidol (ISOVUE-370) 76 % injection 75 mL (75 mLs Intravenous Contrast Given 08/05/17 1208)     Initial Impression / Assessment and Plan / ED Course  I have reviewed the triage vital signs and the nursing notes.  Pertinent labs & imaging results that were available during my care of the patient were reviewed by me and considered in my medical decision making (see chart for details).    VINCENT EHRLER is a 83 y.o. female here with shortness of breath with minimal exertion. Also has been losing weight and very tired. Consider pneumonia vs recurrent UTI vs electrolyte abnormalities vs ACS vs PE. Will get labs, CXR, lactate, cultures, UA. Will hydrate and reassess.    1:05 PM Initial CXR showed possible pneumonia. WBC 13. Lactate 2.1. Given rocephin, azithromycin. CTA performed and showed PE with sclerotic bone lesions, pathologic fractures. Concerned for underlying malignancy. Started on IV heparin. Will admit to hospital for further evaluation and workup. Of note, trop 0.13 likely demand ischemia from PE.   Final Clinical Impressions(s) / ED Diagnoses   Final diagnoses:  None    New Prescriptions New Prescriptions   No medications on file     Charlynne Pander, MD 08/05/17 [phone]

## 2017-08-05 NOTE — Progress Notes (Signed)
ANTICOAGULATION CONSULT NOTE - Follow-Up Consult  Pharmacy Consult for IV heparin + warfarin Indication: PE  Allergies  Allergen Reactions  . Codeine Other (See Comments)    Pt does not remember what happens    Patient Measurements: Height: 5' (152.4 cm) Weight: 114 lb 3.2 oz (51.8 kg) IBW/kg (Calculated) : 45.5 Heparin Dosing Weight: Total body weight  Vital Signs: Temp: 99.8 F (37.7 C) (09/08 2025) Temp Source: Oral (09/08 2025) BP: 108/59 (09/08 2025) Pulse Rate: 88 (09/08 2025)  Labs:  Recent Labs  08/05/17 1103 08/05/17 1106 08/05/17 1116 08/05/17 1547 08/05/17 2133  HGB 12.2  --  14.3  --   --   HCT 35.3*  --  42.0  --   --   PLT 212  --   --   --   --   APTT  --  33  --   --   --   LABPROT  --  14.0  --   --   --   INR  --  1.09  --   --   --   HEPARINUNFRC  --   --   --   --  0.36  CREATININE 1.16*  --  1.10*  --   --   TROPONINI  --   --   --  0.09* 0.12*    Estimated Creatinine Clearance: 26.9 mL/min (A) (by C-G formula based on SCr of 1.1 mg/dL (H)).   Medical History: History reviewed. No pertinent past medical history.   Assessment: [age] yoF presents with shortness of breath. CT chest shows multiple right side pulmonary emboli. Pharmacy consulted to start heparin infusion.  On aspirin 81 mg daily PTA. Not on any anticoagulants PTA.  Baseline Hgb and platelets WNL.  Baseline aPTT, PT/INR WNL. Pharmacy now consulted for warfarin dosing as well. Major drug interactions include antibiotics and levothyroxine.  Today, 9/8 2133 HL=0.36 at goal, no infusion or bleeding issues per RN  Goal of Therapy:  INR 2-3 Heparin level 0.3-0.7 units/ml Monitor platelets by anticoagulation protocol: Yes   Plan:   Continue heparin infusion at 800 units/hr.  Will recheck HL with am labs to ensure stays in therapeutic range  Daily heparin level and CBC while on heparin infusion.  Daily PT/INR  Needs minimum of 5 days overlap between parenteral anticoagulant +  warfarin and therapeutic INR x 2 prior to discontinuation of parenteral anticoagulant.   Provide education prior to discharge.   Lorenza EvangelistGreen, Darcia Lampi R 08/05/2017 11:00 PM

## 2017-08-05 NOTE — ED Triage Notes (Signed)
Her family are with her, and tell me pt. Has had ~ 2 week hx of weakness and anorexia, and a general feeling of unwellness/disquiet with some wt. Loss. She saw her pcp recently, who gave a tentative dx of uti and placed her on Cipro. Subsequent urine testing ruled out uti, so Cipro was ceased. Pt. Continues to feel unwell, coupled with shortness of breath. She is pale and moderately short of breath upon arrival.

## 2017-08-06 ENCOUNTER — Encounter (HOSPITAL_COMMUNITY): Payer: Self-pay

## 2017-08-06 ENCOUNTER — Inpatient Hospital Stay (HOSPITAL_COMMUNITY): Payer: Medicare Other

## 2017-08-06 DIAGNOSIS — E871 Hypo-osmolality and hyponatremia: Secondary | ICD-10-CM

## 2017-08-06 DIAGNOSIS — M899 Disorder of bone, unspecified: Secondary | ICD-10-CM

## 2017-08-06 DIAGNOSIS — R634 Abnormal weight loss: Secondary | ICD-10-CM | POA: Diagnosis present

## 2017-08-06 DIAGNOSIS — I2692 Saddle embolus of pulmonary artery without acute cor pulmonale: Secondary | ICD-10-CM

## 2017-08-06 LAB — COMPREHENSIVE METABOLIC PANEL
ALBUMIN: 2.7 g/dL — AB (ref 3.5–5.0)
ALT: 23 U/L (ref 14–54)
AST: 45 U/L — AB (ref 15–41)
Alkaline Phosphatase: 29 U/L — ABNORMAL LOW (ref 38–126)
Anion gap: 8 (ref 5–15)
BUN: 24 mg/dL — AB (ref 6–20)
CO2: 24 mmol/L (ref 22–32)
CREATININE: 0.8 mg/dL (ref 0.44–1.00)
Calcium: 7.6 mg/dL — ABNORMAL LOW (ref 8.9–10.3)
Chloride: 106 mmol/L (ref 101–111)
GFR calc Af Amer: >60 mL/min (ref >60)
GLUCOSE: 101 mg/dL — AB (ref 65–99)
POTASSIUM: 3.8 mmol/L (ref 3.5–5.1)
SODIUM: 138 mmol/L (ref 135–145)
Total Bilirubin: 0.6 mg/dL (ref 0.3–1.2)
Total Protein: 5.7 g/dL — ABNORMAL LOW (ref 6.5–8.1)

## 2017-08-06 LAB — CBC
HCT: 32.3 % — ABNORMAL LOW (ref 36.0–46.0)
Hemoglobin: 11.1 g/dL — ABNORMAL LOW (ref 12.0–15.0)
MCH: 35 pg — ABNORMAL HIGH (ref 26.0–34.0)
MCHC: 34.4 g/dL (ref 30.0–36.0)
MCV: 101.9 fL — AB (ref 78.0–100.0)
PLATELETS: 210 10*3/uL (ref 150–400)
RBC: 3.17 MIL/uL — AB (ref 3.87–5.11)
RDW: 13 % (ref 11.5–15.5)
WBC: 8 10*3/uL (ref 4.0–10.5)

## 2017-08-06 LAB — HEPARIN LEVEL (UNFRACTIONATED): Heparin Unfractionated: 0.4 IU/mL (ref 0.30–0.70)

## 2017-08-06 LAB — TROPONIN I: TROPONIN I: 0.08 ng/mL — AB (ref <0.03)

## 2017-08-06 LAB — PROTIME-INR
INR: 1.1
Prothrombin Time: 14.1 seconds (ref 11.4–15.2)

## 2017-08-06 MED ORDER — IOPAMIDOL (ISOVUE-300) INJECTION 61%
30.0000 mL | Freq: Once | INTRAVENOUS | Status: AC | PRN
  Administered 2017-08-06: 30 mL via ORAL

## 2017-08-06 MED ORDER — IOPAMIDOL (ISOVUE-300) INJECTION 61%
INTRAVENOUS | Status: AC
  Administered 2017-08-06: 100 mL via INTRAVENOUS
  Filled 2017-08-06: qty 100

## 2017-08-06 MED ORDER — WARFARIN SODIUM 3 MG PO TABS: 3.0000 mg | ORAL_TABLET | Freq: Once | ORAL | Status: DC

## 2017-08-06 MED ORDER — IOPAMIDOL (ISOVUE-300) INJECTION 61%
INTRAVENOUS | Status: AC
  Filled 2017-08-06: qty 30

## 2017-08-06 MED ORDER — SALINE SPRAY 0.65 % NA SOLN
1.0000 | NASAL | Status: DC | PRN
  Filled 2017-08-06: qty 44

## 2017-08-06 NOTE — Progress Notes (Signed)
PROGRESS NOTE    RANDAL YEPIZ  WGN:[id number] DOB: 10/05/1929 DOA: 08/05/2017 PCP: Clovis Riley, L.August Saucer, MD  Brief Narrative: Lydia Barber is a 83 y.o. female with past medical history only significant for hypertension and hypothyroidism. Patient is admitted with increasing weakness shortness of breath and weight loss she lost 2.5 pound weight in the last 1 week.  In ED, sodium was 131, BUN is 30 creatinine is 1.10. CTchest - multiple pulmonary emboli with segmental pulmonary artery branches to the right lower lobe and right middle lobe. Pulmonary embolus was seen in the left pulmonary artery multiple sclerotic lesions of the left-sided ribs were noted   Assessment & Plan:   Active Problems:   Pulmonary emboli (HCC) -continue Heparin -will decide abt NOAC vs lovenox depending on malignancy workup -DC coumadin for now -no evidence of PNA, will stop Abx    Sclerotic rib lesions -CT abd pelvis to look for malignancy -no lesions noted on CT chest     Hyponatremia -improved with hydration, monitor    Weight loss -due to overall debility vs malignancy  -supplements as tolerated    HTN -continue verpamil    Hypothyroidism -continue synthroid  DVT prophylaxis: IV heparin Code Status: Full Code Family Communication: family at bedside Disposition Plan: home pending workup      Subjective: -feels weak and tired, some dyspnea with activity  Objective: Vitals:   08/05/17 1333 08/05/17 1506 08/05/17 2025 08/06/17 0518  BP:  121/65 (!) 108/59 122/66  Pulse:  92 88 87  Resp:  Temp:  98.8 F (37.1 C) 99.8 F (37.7 C) 98.2 F (36.8 C)  TempSrc:  Axillary Oral Oral  SpO2:  96% 97% (!) 87%  Weight: 50.8 kg (112 lb) 51.8 kg (114 lb 3.2 oz)    Height: 5' (1.524 m) 5' (1.524 m)      Intake/Output Summary (Last 24 hours) at 08/06/17 1221 Last data filed at 08/06/17 0700  Gross per 24 hour  Intake          1317.85 ml  Output               50 ml  Net           1267.85 ml   Filed Weights   08/05/17 1333 08/05/17 1506  Weight: 50.8 kg (112 lb) 51.8 kg (114 lb 3.2 oz)    Examination:  General exam: Appears calm and comfortable, frail, elderly Respiratory system: few ronchi, decreased at the bases Cardiovascular system: S1 & S2 heard, RRR. No JVD, murmurs, Gastrointestinal system: Abdomen is nondistended, soft and nontender. Normal bowel sounds heard. Central nervous system: Alert and oriented. No focal neurological deficits. Extremities: Symmetric 5 x 5 power. Skin: No rashes, lesions or ulcers Psychiatry: Judgement and insight appear normal. Mood & affect appropriate.     Data Reviewed:   CBC:  Recent Labs Lab 08/05/17 1103 08/05/17 1116 08/06/17 0518  WBC 12.2*  --  8.0  NEUTROABS 9.8*  --   --   HGB 12.2 14.3 11.1*  HCT 35.3* 42.0 32.3*  MCV 100.9*  --  101.9*  PLT 212  --  210   Basic Metabolic Panel:  Recent Labs Lab 08/05/17 1103 08/05/17 1116 08/06/17 0518  NA 131* 132* 138  K 3.1* 3.5 3.8  CL 95* 96* 106  CO2 20*  --  24  GLUCOSE 116* 116* 101*  BUN 32* 30* 24*  CREATININE 1.16* 1.10* 0.80  CALCIUM 8.4*  --  7.6*   GFR: Estimated Creatinine Clearance: 36.9 mL/min (by C-G formula based on SCr of 0.8 mg/dL). Liver Function Tests:  Recent Labs Lab 08/05/17 1103 08/06/17 0518  AST 43* 45*  ALT 18 23  ALKPHOS 32* 29*  BILITOT 1.2 0.6  PROT 6.8 5.7*  ALBUMIN 3.3* 2.7*   No results for input(s): LIPASE, AMYLASE in the last 168 hours. No results for input(s): AMMONIA in the last 168 hours. Coagulation Profile:  Recent Labs Lab 08/05/17 1106 08/06/17 0518  INR 1.09 1.10   Cardiac Enzymes:  Recent Labs Lab 08/05/17 1547 08/05/17 2133 08/06/17 0518  TROPONINI 0.09* 0.12* 0.08*   BNP (last 3 results) No results for input(s): PROBNP in the last 8760 hours. HbA1C: No results for input(s): HGBA1C in the last 72 hours. CBG: No results for input(s): GLUCAP in the last 168 hours. Lipid  Profile: No results for input(s): CHOL, HDL, LDLCALC, TRIG, CHOLHDL, LDLDIRECT in the last 72 hours. Thyroid Function Tests:  Recent Labs  08/05/17 1106  TSH 3.551   Anemia Panel: No results for input(s): VITAMINB12, FOLATE, FERRITIN, TIBC, IRON, RETICCTPCT in the last 72 hours. Urine analysis:    Component Value Date/Time   COLORURINE AMBER (A) 08/05/2017 1144   APPEARANCEUR HAZY (A) 08/05/2017 1144   LABSPEC 1.026 08/05/2017 1144   PHURINE 5.0 08/05/2017 1144   GLUCOSEU NEGATIVE 08/05/2017 1144   HGBUR NEGATIVE 08/05/2017 1144   BILIRUBINUR NEGATIVE 08/05/2017 1144   KETONESUR 20 (A) 08/05/2017 1144   PROTEINUR 100 (A) 08/05/2017 1144   UROBILINOGEN 0.2 08/26/2011 2109   NITRITE NEGATIVE 08/05/2017 1144   LEUKOCYTESUR NEGATIVE 08/05/2017 1144   Sepsis Labs: @LABRCNTIP (procalcitonin:4,lacticidven:4)  ) Recent Results (from the past 240 hour(s))  Blood culture (routine x 2)     Status: None (Preliminary result)   Collection Time: 08/05/17 10:20 AM  Result Value Ref Range Status   Specimen Description BLOOD LEFT ANTECUBITAL  Final   Special Requests   Final    BOTTLES DRAWN AEROBIC AND ANAEROBIC Blood Culture adequate volume   Culture   Final    NO GROWTH < 24 HOURS Performed at [facility], [address] [place], [place] [address]    Report Status PENDING  Incomplete  Blood culture (routine x 2)     Status: None (Preliminary result)   Collection Time: 08/05/17 11:00 AM  Result Value Ref Range Status   Specimen Description BLOOD RIGHT ANTECUBITAL  Final   Special Requests   Final    BOTTLES DRAWN AEROBIC AND ANAEROBIC Blood Culture results may not be optimal due to an inadequate volume of blood received in culture bottles   Culture   Final    NO GROWTH < 24 HOURS Performed at [facility], [address] [place], [place] [address]    Report Status PENDING  Incomplete         Radiology Studies: Dg Chest 2 View  Result Date:  08/05/2017 CLINICAL DATA:  83 year old female with shortness of breath for the past 2 weeks EXAM: CHEST  2 VIEW COMPARISON:  Prior chest x-ray 08/24/2016 FINDINGS: Stable cardiac and mediastinal contours. Trace atherosclerotic calcification is present in the transverse aorta. An epidural spinal stimulator overlie is the mediastinal shadows. New nodular opacity in the periphery of the left lung base demonstrates an internal air bronchogram suggesting a small focus of infection/inflammation. The lungs are slightly hyperinflated and hyperlucent. No pulmonary edema, pleural effusion or pneumothorax. No acute osseous abnormality. Surgical changes of prior  bilateral shoulder joint arthroplasties. IMPRESSION: 1. Patchy nodular airspace opacity in the periphery of the left lower lobe appears to contain an air bronchograms centrally. This favors a small focus of active infection/ inflammation. However, a true pulmonary nodule is difficult to exclude entirely. Followup PA and lateral chest X-ray is recommended in 3-4 weeks following trial of antibiotic therapy to ensure resolution and exclude underlying malignancy. 2.  Aortic Atherosclerosis (ICD10-170.0) Electronically Signed   By: Malachy Moan M.D.   On: 08/05/2017 10:47   Ct Angio Chest Pe W And/or Wo Contrast  Result Date: 08/05/2017 CLINICAL DATA:  APPROX. 2 week hx of weakness and anorexia, and a general feeling of unwellness/disquiet with some wt. Loss. Pt. Continues to feel unwell, coupled with shortness of breath. She is pale and moderately short of breath upon arrival. EXAM: CT ANGIOGRAPHY CHEST WITH CONTRAST TECHNIQUE: Multidetector CT imaging of the chest was performed using the standard protocol during bolus administration of intravenous contrast. Multiplanar CT image reconstructions and MIPs were obtained to evaluate the vascular anatomy. CONTRAST:  75 cc Isovue 370 COMPARISON:  Chest x-ray from earlier same day. FINDINGS: Cardiovascular: Nearly occlusive  pulmonary embolism is seen within the central segmental pulmonary artery branches to the posterior segment of the right lower lobe (series 7, image 192). Additional smaller nonocclusive pulmonary emboli are seen within the segmental pulmonary artery branches to the right middle lobe and right lower lobe. No left-sided pulmonary emboli identified. Heart size is upper normal. No pericardial effusion. Aortic atherosclerosis. No aortic aneurysm or dissection seen. Mediastinum/Nodes: No mass or enlarged lymph nodes seen within the mediastinum or perihilar regions. Esophagus appears normal. Trachea and central bronchi are unremarkable. Lungs/Pleura: Emphysematous changes within the lung apices, mild to moderate in degree. Additional blebs along the posterior aspects of the right lower lobe and at the left lung base. No consolidation or pleural effusion. No pulmonary nodule or mass identified. No pneumothorax. Upper Abdomen: Limited images of the upper abdomen are unremarkable. Small hypodense lesions are seen within the upper pole of the left kidney, incompletely imaged, best seen lesion compatible with a benign cyst by CT density measurement. 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 Mixed lytic and sclerotic lesion is seen within the adjacent left lateral ninth rib (series 5, image 86). There are additional sclerotic lesions within the left posterior eleventh rib, left anterior-lateral eighth rib and left anterior fourth rib. No convincing lesion identified within the right ribs. Displaced/impacted fracture of the sternum appears chronic, with surrounding sclerotic change. Review of the MIP images confirms the above findings. IMPRESSION: 1. Multiple pulmonary emboli within segmental pulmonary artery branches to the right lower lobe and right middle lobe. Additional small nonocclusive pulmonary embolus within the right interlobar  pulmonary artery. No left-sided pulmonary embolus identified. No evidence of associated right heart strain identified. 2. Multiple sclerotic lesions within the left-sided ribs, as detailed above. There is a slightly displaced fracture of the posterior left tenth rib, likely subacute to chronic in age with nonunion, possibly a pathologic fracture, corresponding to the earlier chest x-ray finding. Additional suspicious lesions are seen within the left lateral ninth rib, left anterior- lateral eighth rib and left anterior fourth rib. If no known primary cancer, consider PET-CT for further characterization. 3. Emphysematous change, upper lobe predominant, mild to moderate in degree. 4. No pneumonia, pleural effusion or pulmonary mass. Aortic Atherosclerosis (ICD10-I70.0) and Emphysema (ICD10-J43.9). These results were called by telephone at  the time of interpretation on 08/05/2017 at 12:56 pm to Dr. Chaney Malling , who verbally acknowledged these results. Electronically Signed   By: Bary Richard M.D.   On: 08/05/2017 13:00        Scheduled Meds: . citalopram  20 mg Oral Daily  . feeding supplement (ENSURE ENLIVE)  237 mL Oral BID BM  . iopamidol      . levothyroxine  75 mcg Oral QAC breakfast  . verapamil  40 mg Oral Q8H   Continuous Infusions: . 0.9 % NaCl with KCl 20 mEq / L 50 mL/hr at 08/06/17 1043  . heparin 800 Units/hr (08/05/17 1408)     LOS: 1 day    Time spent:    Zannie Cove, MD [facility] Pager [phone]  If 7PM-7AM, please contact night-coverage [email or web address] Password [email or web address] 08/06/2017, 12:21 PM

## 2017-08-06 NOTE — Progress Notes (Addendum)
ANTICOAGULATION CONSULT NOTE - Follow-Up Consult  Pharmacy Consult for IV heparin + warfarin Indication: PE  Allergies  Allergen Reactions  . Codeine Other (See Comments)    Pt does not remember what happens    Patient Measurements: Height: 5' (152.4 cm) Weight: 114 lb 3.2 oz (51.8 kg) IBW/kg (Calculated) : 45.5 Heparin Dosing Weight: Total body weight  Vital Signs: Temp: 98.2 F (36.8 C) (09/09 0518) Temp Source: Oral (09/09 0518) BP: 122/66 (09/09 0518) Pulse Rate: 87 (09/09 0518)  Labs:  Recent Labs  08/05/17 1103 08/05/17 1106 08/05/17 1116 08/05/17 1547 08/05/17 2133 08/06/17 0518  HGB 12.2  --  14.3  --   --  11.1*  HCT 35.3*  --  42.0  --   --  32.3*  PLT 212  --   --   --   --  210  APTT  --  33  --   --   --   --   LABPROT  --  14.0  --   --   --  14.1  INR  --  1.09  --   --   --  1.10  HEPARINUNFRC  --   --   --   --  0.36 0.40  CREATININE 1.16*  --  1.10*  --   --  0.80  TROPONINI  --   --   --  0.09* 0.12* 0.08*    Estimated Creatinine Clearance: 36.9 mL/min (by C-G formula based on SCr of 0.8 mg/dL).   Medical History: History reviewed. No pertinent past medical history.   Assessment: Lydia Barber presents with shortness of breath. CT chest shows multiple right side pulmonary emboli. Pharmacy consulted to start heparin infusion.  On aspirin 81 mg daily PTA. Not on any anticoagulants PTA.  Baseline Hgb and platelets WNL.  Baseline aPTT, PT/INR WNL. Pharmacy now consulted for warfarin dosing as well. Major drug interactions include antibiotics and levothyroxine.   Today, 08/06/17:  0518 HL= 0.4 units/ml, therapeutic on heparin infusion at 800 units/hr  CBC: Hgb decreased to 11.1, Pltc WNL  No infusion or bleeding issues per RN  INR = 1.1, subtherapeutic as expected after one dose of warfarin  Goal of Therapy:  INR 2-3 Heparin level 0.3-0.7 units/ml Monitor platelets by anticoagulation protocol: Yes   Plan:   Continue heparin infusion at  800 units/hr.  Repeat warfarin 3mg  PO x 1 today.  Daily heparin level and CBC while on heparin infusion.  Daily PT/INR.  Needs minimum of 5 days overlap between parenteral anticoagulant + warfarin (today is day 2/5) and therapeutic INR x 2 prior to discontinuation of parenteral anticoagulant.   Provide education prior to discharge.   Monitor closely for s/sx of bleeding.   Greer PickerelJigna Akshith Moncus, PharmD, BCPS Pager: [phone] 08/06/2017 8:27 AM

## 2017-08-07 ENCOUNTER — Other Ambulatory Visit: Payer: Self-pay

## 2017-08-07 ENCOUNTER — Telehealth: Payer: Self-pay | Admitting: Hematology and Oncology

## 2017-08-07 ENCOUNTER — Encounter (HOSPITAL_COMMUNITY): Payer: Self-pay

## 2017-08-07 ENCOUNTER — Encounter: Payer: Self-pay | Admitting: Hematology and Oncology

## 2017-08-07 DIAGNOSIS — R748 Abnormal levels of other serum enzymes: Secondary | ICD-10-CM

## 2017-08-07 DIAGNOSIS — M899 Disorder of bone, unspecified: Secondary | ICD-10-CM

## 2017-08-07 DIAGNOSIS — I2782 Chronic pulmonary embolism: Secondary | ICD-10-CM

## 2017-08-07 LAB — COMPREHENSIVE METABOLIC PANEL
ALBUMIN: 2.8 g/dL — AB (ref 3.5–5.0)
ALT: 27 U/L (ref 14–54)
ANION GAP: 11 (ref 5–15)
AST: 49 U/L — ABNORMAL HIGH (ref 15–41)
Alkaline Phosphatase: 32 U/L — ABNORMAL LOW (ref 38–126)
BILIRUBIN TOTAL: 0.8 mg/dL (ref 0.3–1.2)
BUN: 12 mg/dL (ref 6–20)
CO2: 21 mmol/L — ABNORMAL LOW (ref 22–32)
Calcium: 7.5 mg/dL — ABNORMAL LOW (ref 8.9–10.3)
Chloride: 101 mmol/L (ref 101–111)
Creatinine, Ser: 0.68 mg/dL (ref 0.44–1.00)
Glucose, Bld: 92 mg/dL (ref 65–99)
POTASSIUM: 3.5 mmol/L (ref 3.5–5.1)
Sodium: 133 mmol/L — ABNORMAL LOW (ref 135–145)
TOTAL PROTEIN: 5.5 g/dL — AB (ref 6.5–8.1)

## 2017-08-07 LAB — HEPARIN LEVEL (UNFRACTIONATED): Heparin Unfractionated: 0.28 IU/mL — ABNORMAL LOW (ref 0.30–0.70)

## 2017-08-07 LAB — URINE CULTURE: Culture: NO GROWTH

## 2017-08-07 LAB — CBC
HCT: 31.4 % — ABNORMAL LOW (ref 36.0–46.0)
Hemoglobin: 10.8 g/dL — ABNORMAL LOW (ref 12.0–15.0)
MCH: 35.1 pg — AB (ref 26.0–34.0)
MCHC: 34.4 g/dL (ref 30.0–36.0)
MCV: 101.9 fL — AB (ref 78.0–100.0)
PLATELETS: 213 10*3/uL (ref 150–400)
RBC: 3.08 MIL/uL — ABNORMAL LOW (ref 3.87–5.11)
RDW: 13 % (ref 11.5–15.5)
WBC: 7.6 10*3/uL (ref 4.0–10.5)

## 2017-08-07 MED ORDER — APIXABAN 5 MG PO TABS
10.0000 mg | ORAL_TABLET | Freq: Two times a day (BID) | ORAL | Status: DC
  Administered 2017-08-07 – 2017-08-08 (×3): 10 mg via ORAL
  Filled 2017-08-07 (×4): qty 2

## 2017-08-07 MED ORDER — HEPARIN (PORCINE) IN NACL 100-0.45 UNIT/ML-% IJ SOLN
900.0000 [IU]/h | INTRAMUSCULAR | Status: DC
  Administered 2017-08-07: 900 [IU]/h via INTRAVENOUS

## 2017-08-07 MED ORDER — APIXABAN 5 MG PO TABS: 5.0000 mg | ORAL_TABLET | Freq: Two times a day (BID) | ORAL | Status: DC

## 2017-08-07 MED ORDER — ACETAMINOPHEN 325 MG PO TABS
650.0000 mg | ORAL_TABLET | Freq: Four times a day (QID) | ORAL | Status: DC | PRN
  Administered 2017-08-07: 650 mg via ORAL
  Filled 2017-08-07: qty 2

## 2017-08-07 NOTE — Progress Notes (Addendum)
PROGRESS NOTE    Lydia Barber  ZOX:[id number] DOB: 1929-10-30 DOA: 08/05/2017 PCP: Clovis Riley, L.August Saucer, MD  Brief Narrative: Lydia Barber is a 83 y.o. female with past medical history only significant for hypertension and hypothyroidism. Patient is admitted with increasing weakness shortness of breath and weight loss she lost 2.5 pound weight in the last 1 week.  In ED, sodium was 131, BUN is 30 creatinine is 1.10. CTchest - multiple pulmonary emboli with segmental pulmonary artery branches to the right lower lobe and right middle lobe. Pulmonary embolus was seen in the left pulmonary artery multiple sclerotic lesions of the left-sided ribs were noted   Assessment & Plan:   Active Problems:   Acute bilateral Pulmonary emboli (HCC) -will transition from Heparin to apixaban today, discussed anticoagulation options with pt and with her daughter yesterday -hemodynamics more stable -no clear evidence of malignancy based on  -no evidence of PNA, stopped Abx    Sclerotic rib lesions -CT chest/Abd pelvis not suggestive of any clear malignancy, will benefit from PET scan as outpatient -discussed with Dr.Gudena, he will arrange PET and mammogram as outpatient     Hyponatremia -improved with hydration, monitor    Weight loss -due to overall debility vs occult malignancy  -supplements as tolerated    HTN -continue verpamil    Hypothyroidism -continue synthroid    Urinary retention -unclear etiology -foley placed due to recurrent retention  DVT prophylaxis: APixaban Code Status: Full Code Family Communication: family at bedside Disposition Plan: Home, may need ST Rehab     Subjective: -feels quite weak, some dyspnea with any activity  Objective: Vitals:   08/06/17 1446 08/06/17 2044 08/07/17 0432 08/07/17 0811  BP: 128/73 130/63 (!) 146/62 138/60  Pulse: 91 96 93 87  Resp: 18 18    Temp: 98.8 F (37.1 C) 98.5 F (36.9 C) 98 F (36.7 C) 97.7 F (36.5 C)  TempSrc:  Oral Oral Oral Axillary  SpO2: 97% 97% 93% 98%  Weight:      Height:        Intake/Output Summary (Last 24 hours) at 08/07/17 1337 Last data filed at 08/07/17 0600  Gross per 24 hour  Intake              344 ml  Output              575 ml  Net             -231 ml   Filed Weights   08/05/17 1333 08/05/17 1506  Weight: 50.8 kg (112 lb) 51.8 kg (114 lb 3.2 oz)    Examination:  Gen: frail, elderly, debilitated female HEENT: PERRLA, Neck supple, no JVD Lungs: decreased BS at bases CVS: RRR,No Gallops,Rubs or new Murmurs Abd: soft, Non tender, non distended, BS present Extremities: No Cyanosis, Clubbing or edema Skin: no new rashes Psychiatry: Judgement and insight appear normal. Mood & affect appropriate.     Data Reviewed:   CBC:  Recent Labs Lab 08/05/17 1103 08/05/17 1116 08/06/17 0518 08/07/17 0446  WBC 12.2*  --  8.0 7.6  NEUTROABS 9.8*  --   --   --   HGB 12.2 14.3 11.1* 10.8*  HCT 35.3* 42.0 32.3* 31.4*  MCV 100.9*  --  101.9* 101.9*  PLT 212  --  210 213   Basic Metabolic Panel:  Recent Labs Lab 08/05/17 1103 08/05/17 1116 08/06/17 0518 08/07/17 0446  NA 131* 132* 138 133*  K 3.1* 3.5 3.8 3.5  CL  95* 96* 106 101  CO2 20*  --  24 21*  GLUCOSE 116* 116* 101* 92  BUN 32* 30* 24* 12  CREATININE 1.16* 1.10* 0.80 0.68  CALCIUM 8.4*  --  7.6* 7.5*   GFR: Estimated Creatinine Clearance: 36.9 mL/min (by C-G formula based on SCr of 0.68 mg/dL). Liver Function Tests:  Recent Labs Lab 08/05/17 1103 08/06/17 0518 08/07/17 0446  AST 43* 45* 49*  ALT 18 23 27   ALKPHOS 32* 29* 32*  BILITOT 1.2 0.6 0.8  PROT 6.8 5.7* 5.5*  ALBUMIN 3.3* 2.7* 2.8*   No results for input(s): LIPASE, AMYLASE in the last 168 hours. No results for input(s): AMMONIA in the last 168 hours. Coagulation Profile:  Recent Labs Lab 08/05/17 1106 08/06/17 0518  INR 1.09 1.10   Cardiac Enzymes:  Recent Labs Lab 08/05/17 1547 08/05/17 2133 08/06/17 0518  TROPONINI  0.09* 0.12* 0.08*   BNP (last 3 results) No results for input(s): PROBNP in the last 8760 hours. HbA1C: No results for input(s): HGBA1C in the last 72 hours. CBG: No results for input(s): GLUCAP in the last 168 hours. Lipid Profile: No results for input(s): CHOL, HDL, LDLCALC, TRIG, CHOLHDL, LDLDIRECT in the last 72 hours. Thyroid Function Tests:  Recent Labs  08/05/17 1106  TSH 3.551   Anemia Panel: No results for input(s): VITAMINB12, FOLATE, FERRITIN, TIBC, IRON, RETICCTPCT in the last 72 hours. Urine analysis:    Component Value Date/Time   COLORURINE AMBER (A) 08/05/2017 1144   APPEARANCEUR HAZY (A) 08/05/2017 1144   LABSPEC 1.026 08/05/2017 1144   PHURINE 5.0 08/05/2017 1144   GLUCOSEU NEGATIVE 08/05/2017 1144   HGBUR NEGATIVE 08/05/2017 1144   BILIRUBINUR NEGATIVE 08/05/2017 1144   KETONESUR 20 (A) 08/05/2017 1144   PROTEINUR 100 (A) 08/05/2017 1144   UROBILINOGEN 0.2 08/26/2011 2109   NITRITE NEGATIVE 08/05/2017 1144   LEUKOCYTESUR NEGATIVE 08/05/2017 1144   Sepsis Labs: @LABRCNTIP (procalcitonin:4,lacticidven:4)  ) Recent Results (from the past 240 hour(s))  Blood culture (routine x 2)     Status: None (Preliminary result)   Collection Time: 08/05/17 10:20 AM  Result Value Ref Range Status   Specimen Description BLOOD LEFT ANTECUBITAL  Final   Special Requests   Final    BOTTLES DRAWN AEROBIC AND ANAEROBIC Blood Culture adequate volume   Culture   Final    NO GROWTH < 24 HOURS Performed at [facility], [address] [place], [place] [address]    Report Status PENDING  Incomplete  Blood culture (routine x 2)     Status: None (Preliminary result)   Collection Time: 08/05/17 11:00 AM  Result Value Ref Range Status   Specimen Description BLOOD RIGHT ANTECUBITAL  Final   Special Requests   Final    BOTTLES DRAWN AEROBIC AND ANAEROBIC Blood Culture results may not be optimal due to an inadequate volume of blood received in culture bottles   Culture    Final    NO GROWTH < 24 HOURS Performed at [facility], [address] [place], [place] [address]    Report Status PENDING  Incomplete  Urine culture     Status: None   Collection Time: 08/05/17 11:44 AM  Result Value Ref Range Status   Specimen Description URINE, RANDOM  Final   Special Requests NONE  Final   Culture   Final    NO GROWTH Performed at [facility], [address] [place], [place] [address]    Report Status  08/07/2017 FINAL  Final         Radiology Studies: Ct Abdomen Pelvis W Contrast  Result Date: 08/06/2017 CLINICAL DATA:  Evaluate malignancy or metastasis. Newly diagnosed pulmonary emboli. EXAM: CT ABDOMEN AND PELVIS WITH CONTRAST TECHNIQUE: Multidetector CT imaging of the abdomen and pelvis was performed using the standard protocol following bolus administration of intravenous contrast. CONTRAST:  30mL ISOVUE-300 IOPAMIDOL (ISOVUE-300) INJECTION 61%, <See Chart> ISOVUE-300 IOPAMIDOL (ISOVUE-300) INJECTION 61% COMPARISON:  None. FINDINGS: Lower chest: Mild atelectasis at the right lung base. Hepatobiliary: No focal liver abnormality is seen. No gallstones, gallbladder wall thickening, or biliary dilatation. Pancreas: Unremarkable. No pancreatic ductal dilatation or surrounding inflammatory changes. Spleen: Normal in size without focal abnormality. Adrenals/Urinary Tract: Adrenal glands are unremarkable. Multiple renal cysts bilaterally. No suspicious mass. No renal stone or hydronephrosis. Bladder appears normal. Stomach/Bowel: Questionable thickening of the walls of the rectum but not convincing. No dilated large or small bowel loops. Scattered diverticulosis within the sigmoid and descending colon but no evidence of acute diverticulitis. Stomach is unremarkable. Vascular/Lymphatic: Aortic atherosclerosis. No enlarged lymph nodes seen within the abdomen or pelvis. Reproductive: Presumed hysterectomy.  No adnexal mass. Other: No free fluid or abscess. No  soft tissue mass. No free intraperitoneal air. Musculoskeletal: Fixation hardware within the lower lumbar spine appears intact and appropriately positioned. Degenerative changes throughout the slightly scoliotic lumbar spine, mild to moderate in degree. Additional degenerative spurring noted at each hip joint. Chronic appearing compression fracture deformity of the L1 vertebral body, with approximately 7 mm retropulsion causing moderate central canal stenosis and possible associated nerve root impingement. No acute or suspicious osseous finding within the abdomen or pelvis. Appearance of the lower ribs described on yesterday's chest CT report. IMPRESSION: 1. Questionable thickening of the walls of the rectum but not convincing. Otherwise, no evidence of malignancy within the abdomen or pelvis. 2. No acute findings within the abdomen or pelvis. 3. Colonic diverticulosis without evidence of acute diverticulitis. 4. Renal cysts. 5. Degenerative/chronic findings within the lumbar spine as detailed above. No acute or suspicious finding within the lumbar spine or osseous pelvis. Electronically Signed   By: Bary Richard M.D.   On: 08/06/2017 13:31        Scheduled Meds: . apixaban  10 mg Oral BID   Followed by  . [START ON 08/14/2017] apixaban  5 mg Oral BID  . citalopram  20 mg Oral Daily  . feeding supplement (ENSURE ENLIVE)  237 mL Oral BID BM  . levothyroxine  75 mcg Oral QAC breakfast  . verapamil  40 mg Oral Q8H   Continuous Infusions:    LOS: 2 days    Time spent:    Zannie Cove, MD [facility] Pager [phone]  If 7PM-7AM, please contact night-coverage [email or web address] Password [email or web address] 08/07/2017, 1:37 PM

## 2017-08-07 NOTE — Progress Notes (Signed)
ANTICOAGULATION CONSULT NOTE - Follow-Up Consult  Pharmacy Consult for IV heparin --> PO Apixaban  Indication: PE  Allergies  Allergen Reactions  . Codeine Other (See Comments)    Pt does not remember what happens    Patient Measurements: Height: 5' (152.4 cm) Weight: 114 lb 3.2 oz (51.8 kg) IBW/kg (Calculated) : 45.5 Heparin Dosing Weight: Total body weight  Vital Signs: Temp: 97.7 F (36.5 C) (09/10 0811) Temp Source: Axillary (09/10 0811) BP: 138/60 (09/10 0811) Pulse Rate: 87 (09/10 0811)  Labs:  Recent Labs  08/05/17 1103 08/05/17 1106 08/05/17 1116 08/05/17 1547 08/05/17 2133 08/06/17 0518 08/07/17 0446  HGB 12.2  --  14.3  --   --  11.1* 10.8*  HCT 35.3*  --  42.0  --   --  32.3* 31.4*  PLT 212  --   --   --   --  210 213  APTT  --  33  --   --   --   --   --   LABPROT  --  14.0  --   --   --  14.1  --   INR  --  1.09  --   --   --  1.10  --   HEPARINUNFRC  --   --   --   --  0.36 0.40 0.28*  CREATININE 1.16*  --  1.10*  --   --  0.80 0.68  TROPONINI  --   --   --  0.09* 0.12* 0.08*  --     Estimated Creatinine Clearance: 36.9 mL/min (by C-G formula based on SCr of 0.68 mg/dL).   Medical History: History reviewed. No pertinent past medical history.   Assessment: [age] yoF presents with shortness of breath. CT chest shows multiple right side pulmonary emboli. Pharmacy consulted to start heparin infusion.  On aspirin 81 mg daily PTA. Not on any anticoagulants PTA.  Baseline Hgb and platelets WNL.  Baseline aPTT, PT/INR WNL. Pharmacy asked to transition patient to Apixaban today.   Today, 08/07/17:  Heparin infusion currently running at 900 units/hr  CBC: Hgb decreased to 10.8, Pltc WNL  No bleeding issues reported per nursing  SCr 0.68, CrCl ~ 34 ml/min    Plan:   Stop heparin infusion now.  Start Apixaban 10mg  PO BID x 7 days, then 5mg  PO BID thereafter  Stop PTA ASA 81mg  PO daily per discussion with Dr. Jomarie LongsJoseph (currently NOT ordered  inpatient).   Provide education prior to discharge.   Monitor CBC and for s/sx of bleeding   Greer PickerelJigna Merrilee Ancona, PharmD, BCPS Pager: [phone] 08/07/2017 10:05 AM

## 2017-08-07 NOTE — Telephone Encounter (Signed)
Hospital follow up appt has been scheduled for the pt to see Dr. Pamelia HoitGudena on 9/20 at 1130am. Letter mailed with appt information.

## 2017-08-07 NOTE — Care Management Note (Signed)
Case Management Note  Patient Details  Name: Lydia Barber MRN: [id number] Date of Birth: 1929/01/03  Subjective/Objective: 83 y/o f admitted w/PE. From home w/spouse.                   Action/Plan:d/c plan home.   Expected Discharge Date:                  Expected Discharge Plan:  Home/Self Care  In-House Referral:     Discharge planning Services  CM Consult  Post Acute Care Choice:    Choice offered to:     DME Arranged:    DME Agency:     HH Arranged:    HH Agency:     Status of Service:  In process, will continue to follow  If discussed at Long Length of Stay Meetings, dates discussed:    Additional Comments:  Lydia Barber, Lydia Wholey, RN 08/07/2017, 12:51 PM

## 2017-08-07 NOTE — Progress Notes (Signed)
Inserted Foley, per order (due to urinary retention). 16 fr. Urine return was >52375mL. Patient experienced relief and is resting comfortably.

## 2017-08-07 NOTE — Progress Notes (Signed)
Initial Nutrition Assessment  DOCUMENTATION CODES:   Severe malnutrition in context of acute illness/injury  INTERVENTION:   -Continue Ensure Enlive po BID, each supplement provides 350 kcal and 20 grams of protein -Encourage PO intake -RD will continue to monitor for needs  NUTRITION DIAGNOSIS:   Malnutrition (severe) related to acute illness, poor appetite (pulmonary emboli) as evidenced by energy intake < or equal to 50% for > or equal to 5 days, moderate depletion of body fat, moderate depletions of muscle mass.  GOAL:   Patient will meet greater than or equal to 90% of their needs  MONITOR:   PO intake, Supplement acceptance, Labs, Weight trends, I & O's  REASON FOR ASSESSMENT:   Malnutrition Screening Tool    ASSESSMENT:    83 y.o. female with past medical history only significant for hypertension and hypothyroidism. Patient is admitted with increasing weakness shortness of breath and weight loss she lost 2.5 pound weight in the last 1 week.   Patient in room with husband at bedside. Pt reports poor appetite for at least a week. Pt's husband states the pt has never been a big eater. Pt states for breakfast she would usually eat ~2 cookies and lunch would consist of a pack of crackers or a sandwich. The only "big" meal would be dinner, typically some meat with sides. Pt's husband states she wouldn't eat that much. Over the past week, the pt has eaten basically nothing.   Pt with unopened Ensure supplements at bedside. Patient reports she does like them. Encouraged pt to drink supplements and if needs them to be cold, staff can provide her a cup of ice.  Pt denies issues swallowing or chewing.  Pt reports losing 2.5 lbs over the past week. This is insignificant amount for time frame. Pt now closer to UBW of 115 lb. Nutrition-Focused physical exam completed. Findings are mild-moderate fat depletion, mild-moderate muscle depletion, and no edema.   Medications  reviewed. Labs reviewed: Low Na  Diet Order:  Diet Heart Room service appropriate? Yes; Fluid consistency: Thin  Skin:  Reviewed, no issues  Last BM:  9/10  Height:   Ht Readings from Last 1 Encounters:  08/05/17 5' (1.524 m)    Weight:   Wt Readings from Last 1 Encounters:  08/05/17 114 lb 3.2 oz (51.8 kg)    Ideal Body Weight:  45.5 kg  BMI:  Body mass index is 22.3 kg/m.  Estimated Nutritional Needs:   Kcal:  1300-1500  Protein:  60-70g  Fluid:  1.5L/day  EDUCATION NEEDS:   Education needs addressed  Lydia FrancoLindsey Naithan Delage, MS, RD, LDN Pager: [phone] After Hours Pager: [phone]

## 2017-08-07 NOTE — Progress Notes (Signed)
ANTICOAGULATION CONSULT NOTE - Follow-Up Consult  Pharmacy Consult for IV heparin + warfarin Indication: PE  Allergies  Allergen Reactions  . Codeine Other (See Comments)    Pt does not remember what happens    Patient Measurements: Height: 5' (152.4 cm) Weight: 114 lb 3.2 oz (51.8 kg) IBW/kg (Calculated) : 45.5 Heparin Dosing Weight: Total body weight  Vital Signs: Temp: 98 F (36.7 C) (09/10 0432) Temp Source: Oral (09/10 0432) BP: 146/62 (09/10 0432) Pulse Rate: 93 (09/10 0432)  Labs:  Recent Labs  08/05/17 1103 08/05/17 1106 08/05/17 1116 08/05/17 1547 08/05/17 2133 08/06/17 0518 08/07/17 0446  HGB 12.2  --  14.3  --   --  11.1* 10.8*  HCT 35.3*  --  42.0  --   --  32.3* 31.4*  PLT 212  --   --   --   --  210 213  APTT  --  33  --   --   --   --   --   LABPROT  --  14.0  --   --   --  14.1  --   INR  --  1.09  --   --   --  1.10  --   HEPARINUNFRC  --   --   --   --  0.36 0.40 0.28*  CREATININE 1.16*  --  1.10*  --   --  0.80 0.68  TROPONINI  --   --   --  0.09* 0.12* 0.08*  --     Estimated Creatinine Clearance: 36.9 mL/min (by C-G formula based on SCr of 0.68 mg/dL).   Medical History: History reviewed. No pertinent past medical history.   Assessment: [age] yoF presents with shortness of breath. CT chest shows multiple right side pulmonary emboli. Pharmacy consulted to start heparin infusion.  On aspirin 81 mg daily PTA. Not on any anticoagulants PTA.  Baseline Hgb and platelets WNL.  Baseline aPTT, PT/INR WNL. Pharmacy now consulted for warfarin dosing as well. Major drug interactions include antibiotics and levothyroxine.   9/9  0518 HL= 0.4 units/ml, therapeutic on heparin infusion at 800 units/hr  CBC: Hgb decreased to 11.1, Pltc WNL  No infusion or bleeding issues per RN  INR = 1.1, subtherapeutic as expected after one dose of warfarin Today, 9/10  0446 HL=0.28 below goal, no infusion or bleeding issues per RN.  Goal of Therapy:  INR  2-3 Heparin level 0.3-0.7 units/ml Monitor platelets by anticoagulation protocol: Yes   Plan:   Increase heparin drip to 900 units/hr  Recheck HL in 8 hours  Daily heparin level and CBC while on heparin infusion.  Daily PT/INR.  Needs minimum of 5 days overlap between parenteral anticoagulant + warfarin (today is day 2/5) and therapeutic INR x 2 prior to discontinuation of parenteral anticoagulant.   Provide education prior to discharge.   Monitor closely for s/sx of bleeding.    Lorenza EvangelistGreen, Ronn Smolinsky R 08/06/2017 8:27 AM

## 2017-08-08 ENCOUNTER — Telehealth: Payer: Self-pay | Admitting: Hematology and Oncology

## 2017-08-08 DIAGNOSIS — R634 Abnormal weight loss: Secondary | ICD-10-CM

## 2017-08-08 LAB — BASIC METABOLIC PANEL
ANION GAP: 9 (ref 5–15)
BUN: 11 mg/dL (ref 6–20)
CALCIUM: 7.6 mg/dL — AB (ref 8.9–10.3)
CO2: 23 mmol/L (ref 22–32)
Chloride: 103 mmol/L (ref 101–111)
Creatinine, Ser: 0.72 mg/dL (ref 0.44–1.00)
Glucose, Bld: 94 mg/dL (ref 65–99)
POTASSIUM: 3.6 mmol/L (ref 3.5–5.1)
SODIUM: 135 mmol/L (ref 135–145)

## 2017-08-08 LAB — CBC
HCT: 32.7 % — ABNORMAL LOW (ref 36.0–46.0)
Hemoglobin: 11.1 g/dL — ABNORMAL LOW (ref 12.0–15.0)
MCH: 34.8 pg — ABNORMAL HIGH (ref 26.0–34.0)
MCHC: 33.9 g/dL (ref 30.0–36.0)
MCV: 102.5 fL — ABNORMAL HIGH (ref 78.0–100.0)
PLATELETS: 210 10*3/uL (ref 150–400)
RBC: 3.19 MIL/uL — AB (ref 3.87–5.11)
RDW: 12.9 % (ref 11.5–15.5)
WBC: 6.1 10*3/uL (ref 4.0–10.5)

## 2017-08-08 MED ORDER — APIXABAN 5 MG PO TABS: 5.0000 mg | ORAL_TABLET | Freq: Two times a day (BID) | ORAL | 0 refills | Status: AC

## 2017-08-08 MED ORDER — APIXABAN 5 MG PO TABS: 5.0000 mg | ORAL_TABLET | Freq: Two times a day (BID) | ORAL | 0 refills | Status: DC

## 2017-08-08 MED ORDER — APIXABAN 5 MG PO TABS: 10.0000 mg | ORAL_TABLET | Freq: Two times a day (BID) | ORAL | 0 refills | Status: DC

## 2017-08-08 MED ORDER — APIXABAN (ELIQUIS) EDUCATION KIT FOR DVT/PE PATIENTS
PACK | Freq: Once | Status: AC
  Administered 2017-08-08: 10:00:00
  Filled 2017-08-08: qty 1

## 2017-08-08 NOTE — Care Management Important Message (Signed)
Important Message  Patient Details  Name: Lydia Barber MRN: [id number] Date of Birth: 06-13-1929   Medicare Important Message Given:  Yes    Caren MacadamFuller, Kacelyn Rowzee 08/08/2017, 12:36 PMImportant Message  Patient Details  Name: Lydia Barber MRN: [id number] Date of Birth: 06-13-1929   Medicare Important Message Given:  Yes    Caren MacadamFuller, Genie Wenke 08/08/2017, 12:35 PM

## 2017-08-08 NOTE — Discharge Instructions (Signed)
Information on my medicine - ELIQUIS (apixaban)  This medication education was reviewed with me or my healthcare representative as part of my discharge preparation.    Why was Eliquis prescribed for you? Eliquis was prescribed to treat blood clots that were found in your lungs (pulmonary embolism) and to reduce the risk of them occurring again.  What do You need to know about Eliquis ? The starting dose is 10 mg (two 5 mg tablets) taken TWICE daily for the FIRST SEVEN (7) DAYS, then on 08/14/2017  the dose is reduced to ONE 5 mg tablet taken TWICE daily.  Eliquis may be taken with or without food.   Try to take the dose about the same time in the morning and in the evening. If you have difficulty swallowing the tablet whole please discuss with your pharmacist how to take the medication safely.  Take Eliquis exactly as prescribed and DO NOT stop taking Eliquis without talking to the doctor who prescribed the medication.  Stopping may increase your risk of developing a new blood clot.  Refill your prescription before you run out.  After discharge, you should have regular check-up appointments with your healthcare provider that is prescribing your Eliquis.    What do you do if you miss a dose? If a dose of ELIQUIS is not taken at the scheduled time, take it as soon as possible on the same day and twice-daily administration should be resumed. The dose should not be doubled to make up for a missed dose.  Important Safety Information A possible side effect of Eliquis is bleeding. You should call your healthcare provider right away if you experience any of the following: ? Bleeding from an injury or your nose that does not stop. ? Unusual colored urine (red or dark brown) or unusual colored stools (red or black). ? Unusual bruising for unknown reasons. ? A serious fall or if you hit your head (even if there is no bleeding).  Some medicines may interact with Eliquis and might increase  your risk of bleeding or clotting while on Eliquis. To help avoid this, consult your healthcare provider or pharmacist prior to using any new prescription or non-prescription medications, including herbals, vitamins, non-steroidal anti-inflammatory drugs (NSAIDs) and supplements.  This website has more information on Eliquis (apixaban): http://www.eliquis.com/eliquis/home

## 2017-08-08 NOTE — Progress Notes (Signed)
Patient and her husband given discharge, follow up, and medication instructions, verbalized understanding, IV and telemetry removed, will DC with foley catheter intact, personal belongings with patient, family to transport home

## 2017-08-08 NOTE — Discharge Summary (Addendum)
Physician Discharge Summary  [name]:[id number] DOB: March 25, 1929 DOA: 08/05/2017  PCP: Clovis Riley, L.August Saucer, MD  Admit date: 08/05/2017 Discharge date: 08/08/2017  Time spent: 35 minutes  Recommendations for Outpatient Follow-up:  1. PCP in 1 week 2. [facility] in 1 week for VOiding trail to DC catheter 3. Heme/Onc Dr..Gudena on 9/20, plan for PET scan and mammogram 4. Home health PT/OT/RN   Discharge Diagnoses:  Principal Problem:   Pulmonary emboli (HCC)   Sclerotic Rib lesions   Weight loss   Hyponatremia   Urinary retention   Discharge Condition: stable  Diet recommendation: heart healthy  Filed Weights   08/05/17 1333 08/05/17 1506  Weight: 50.8 kg (112 lb) 51.8 kg (114 lb 3.2 oz)    History of present illness:  Lydia Barber a 83 y.o.femalewithpast medical history only significant for hypertension and hypothyroidism. Patient is admitted with increasing weakness shortness of breath and weight loss she lost 2.5 pound weight in the last 1 week.  In ED, sodium was 131, BUN is 30 creatinine is 1.10. CTchest - multiple pulmonary emboli with segmental pulmonary artery branches to the right lower lobe and right middle lobe. Pulmonary embolus was seen in the left pulmonary artery multiple sclerotic lesions of the left-sided ribs were noted  Hospital Course:    Acute bilateral Pulmonary emboli (HCC) -improved and stable no, transitioned from Heparin to apixaban yesterday, discussed anticoagulation options with pt and with her daughter 9/9 -hemodynamics stable now -no clear evidence of malignancy based on CT abd pelvis and CT chest -no evidence of PNA, stopped Abx -discharged home on Apixaban    Sclerotic rib lesions -CT chest/Abd pelvis not suggestive of any clear malignancy, will benefit from PET scan as outpatient -discussed with Dr.Gudena, he will arrange PET and mammogram as outpatient, FU on 9/20     Hyponatremia -improved with hydration     Weight loss -due to overall debility vs occult malignancy  -supplements as tolerated    HTN -continue verpamil    Hypothyroidism -continue synthroid    Urinary retention -unclear etiology -foley placed due to recurrent retention, will need discharge with foley and FU in 1 week with Urology for voiding trail, CT abd did not show any abnormality -didn't start her on flomax due to soft BPs   Discharge Exam: Vitals:   08/07/17 2118 08/08/17 0558  BP: 138/81 (!) 141/76  Pulse: 87 88  Resp: 18 20  Temp: 98.4 F (36.9 C) 98 F (36.7 C)  SpO2: 98% 95%    General: AAOx3 Cardiovascular: S1S2/RRR Respiratory: CTAB  Discharge Instructions   Discharge Instructions    Diet - low sodium heart healthy    Complete by:  As directed    Increase activity slowly    Complete by:  As directed      Current Discharge Medication List    START taking these medications   Details  !! apixaban (ELIQUIS) 5 MG TABS tablet Take 1 tablet (5 mg total) by mouth 2 (two) times daily. Starting 9/17 Qty: 60 tablet, Refills: 0    !! apixaban (ELIQUIS) 5 MG TABS tablet Take 2 tablets (10 mg total) by mouth 2 (two) times daily. For 6days then start  BID Qty: 22 tablet, Refills: 0     !! - Potential duplicate medications found. Please discuss with provider.    CONTINUE these medications which have NOT CHANGED   Details  acetaminophen (TYLENOL) 500 MG tablet Take 1,000 mg by mouth 2 (two) times daily as  needed.    Calcium 600-400 MG-UNIT CHEW Chew 1 tablet by mouth daily.    citalopram (CELEXA) 20 MG tablet Take 20 mg by mouth daily. Refills: 3    docusate sodium (COLACE) 100 MG capsule Take 100 mg by mouth daily as needed for mild constipation.    Misc Natural Products (OSTEO BI-FLEX ADV JOINT SHIELD) TABS Take 1 tablet by mouth daily.    Multiple Vitamin (MULTIVITAMIN) tablet Take 1 tablet by mouth daily.    ranitidine (ZANTAC) 75 MG tablet Take 75 mg by mouth 2 (two) times daily.     SYNTHROID 75 MCG tablet Take 75 mcg by mouth daily. Refills: 3    verapamil (CALAN-SR) 180 MG CR tablet Take 180 mg by mouth 2 times daily at 12 noon and 4 pm. Refills: 3      STOP taking these medications     aspirin 81 MG tablet        Allergies  Allergen Reactions  . Codeine Other (See Comments)    Pt does not remember what happens   Follow-up Information    Mitchell, L.August Saucer, MD. Schedule an appointment as soon as possible for a visit in 1 week(s).   Specialty:  Family Medicine Contact information: [address] [place] [address] [phone]        [facility]. Schedule an appointment as soon as possible for a visit in 1 week(s).   Why:  for Voiding Trial to try to remove catheter Contact information: [address] [place] [address] [phone]       Serena Croissant, MD Follow up on 08/17/2017.   Specialty:  Hematology and Oncology Why:  at 11:30am Contact information: [address] [place] [address] [phone]        [facility] Follow up.   Why:  HH nursing,physical/occupational therapy Contact information: [address] [place] [address] [phone]            The results of significant diagnostics from this hospitalization (including imaging, microbiology, ancillary and laboratory) are listed below for reference.    Significant Diagnostic Studies: Dg Chest 2 View  Result Date: 08/05/2017 CLINICAL DATA:  83 year old female with shortness of breath for the past 2 weeks EXAM: CHEST  2 VIEW COMPARISON:  Prior chest x-ray 08/24/2016 FINDINGS: Stable cardiac and mediastinal contours. Trace atherosclerotic calcification is present in the transverse aorta. An epidural spinal stimulator overlie is the mediastinal shadows. New nodular opacity in the periphery of the left lung base demonstrates an internal air bronchogram suggesting a small focus  of infection/inflammation. The lungs are slightly hyperinflated and hyperlucent. No pulmonary edema, pleural effusion or pneumothorax. No acute osseous abnormality. Surgical changes of prior bilateral shoulder joint arthroplasties. IMPRESSION: 1. Patchy nodular airspace opacity in the periphery of the left lower lobe appears to contain an air bronchograms centrally. This favors a small focus of active infection/ inflammation. However, a true pulmonary nodule is difficult to exclude entirely. Followup PA and lateral chest X-ray is recommended in 3-4 weeks following trial of antibiotic therapy to ensure resolution and exclude underlying malignancy. 2.  Aortic Atherosclerosis (ICD10-170.0) Electronically Signed   By: Malachy Moan M.D.   On: 08/05/2017 10:47   Ct Angio Chest Pe W And/or Wo Contrast  Result Date: 08/05/2017 CLINICAL DATA:  APPROX. 2 week hx of weakness and anorexia, and a general feeling of unwellness/disquiet with some wt. Loss. Pt. Continues to feel unwell, coupled  with shortness of breath. She is pale and moderately short of breath upon arrival. EXAM: CT ANGIOGRAPHY CHEST WITH CONTRAST TECHNIQUE: Multidetector CT imaging of the chest was performed using the standard protocol during bolus administration of intravenous contrast. Multiplanar CT image reconstructions and MIPs were obtained to evaluate the vascular anatomy. CONTRAST:  75 cc Isovue 370 COMPARISON:  Chest x-ray from earlier same day. FINDINGS: Cardiovascular: Nearly occlusive pulmonary embolism is seen within the central segmental pulmonary artery branches to the posterior segment of the right lower lobe (series 7, image 192). Additional smaller nonocclusive pulmonary emboli are seen within the segmental pulmonary artery branches to the right middle lobe and right lower lobe. No left-sided pulmonary emboli identified. Heart size is upper normal. No pericardial effusion. Aortic atherosclerosis. No aortic aneurysm or dissection seen.  Mediastinum/Nodes: No mass or enlarged lymph nodes seen within the mediastinum or perihilar regions. Esophagus appears normal. Trachea and central bronchi are unremarkable. Lungs/Pleura: Emphysematous changes within the lung apices, mild to moderate in degree. Additional blebs along the posterior aspects of the right lower lobe and at the left lung base. No consolidation or pleural effusion. No pulmonary nodule or mass identified. No pneumothorax. Upper Abdomen: Limited images of the upper abdomen are unremarkable. Small hypodense lesions are seen within the upper pole of the left kidney, incompletely imaged, best seen lesion compatible with a benign cyst by CT density measurement. 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 Mixed lytic and sclerotic lesion is seen within the adjacent left lateral ninth rib (series 5, image 86). There are additional sclerotic lesions within the left posterior eleventh rib, left anterior-lateral eighth rib and left anterior fourth rib. No convincing lesion identified within the right ribs. Displaced/impacted fracture of the sternum appears chronic, with surrounding sclerotic change. Review of the MIP images confirms the above findings. IMPRESSION: 1. Multiple pulmonary emboli within segmental pulmonary artery branches to the right lower lobe and right middle lobe. Additional small nonocclusive pulmonary embolus within the right interlobar pulmonary artery. No left-sided pulmonary embolus identified. No evidence of associated right heart strain identified. 2. Multiple sclerotic lesions within the left-sided ribs, as detailed above. There is a slightly displaced fracture of the posterior left tenth rib, likely subacute to chronic in age with nonunion, possibly a pathologic fracture, corresponding to the earlier chest x-ray finding. Additional suspicious lesions are seen within the left lateral ninth rib,  left anterior- lateral eighth rib and left anterior fourth rib. If no known primary cancer, consider PET-CT for further characterization. 3. Emphysematous change, upper lobe predominant, mild to moderate in degree. 4. No pneumonia, pleural effusion or pulmonary mass. Aortic Atherosclerosis (ICD10-I70.0) and Emphysema (ICD10-J43.9). These results were called by telephone at the time of interpretation on 08/05/2017 at 12:56 pm to Dr. Chaney Malling , who verbally acknowledged these results. Electronically Signed   By: Bary Richard M.D.   On: 08/05/2017 13:00   Ct Abdomen Pelvis W Contrast  Result Date: 08/06/2017 CLINICAL DATA:  Evaluate malignancy or metastasis. Newly diagnosed pulmonary emboli. EXAM: CT ABDOMEN AND PELVIS WITH CONTRAST TECHNIQUE: Multidetector CT imaging of the abdomen and pelvis was performed using the standard protocol following bolus administration of intravenous contrast. CONTRAST:  30mL ISOVUE-300 IOPAMIDOL (ISOVUE-300) INJECTION 61%, <See Chart> ISOVUE-300 IOPAMIDOL (ISOVUE-300) INJECTION 61% COMPARISON:  None. FINDINGS: Lower chest: Mild atelectasis at the right lung base. Hepatobiliary: No focal liver abnormality is seen. No gallstones, gallbladder wall thickening, or biliary dilatation. Pancreas:  Unremarkable. No pancreatic ductal dilatation or surrounding inflammatory changes. Spleen: Normal in size without focal abnormality. Adrenals/Urinary Tract: Adrenal glands are unremarkable. Multiple renal cysts bilaterally. No suspicious mass. No renal stone or hydronephrosis. Bladder appears normal. Stomach/Bowel: Questionable thickening of the walls of the rectum but not convincing. No dilated large or small bowel loops. Scattered diverticulosis within the sigmoid and descending colon but no evidence of acute diverticulitis. Stomach is unremarkable. Vascular/Lymphatic: Aortic atherosclerosis. No enlarged lymph nodes seen within the abdomen or pelvis. Reproductive: Presumed hysterectomy.  No adnexal  mass. Other: No free fluid or abscess. No soft tissue mass. No free intraperitoneal air. Musculoskeletal: Fixation hardware within the lower lumbar spine appears intact and appropriately positioned. Degenerative changes throughout the slightly scoliotic lumbar spine, mild to moderate in degree. Additional degenerative spurring noted at each hip joint. Chronic appearing compression fracture deformity of the L1 vertebral body, with approximately 7 mm retropulsion causing moderate central canal stenosis and possible associated nerve root impingement. No acute or suspicious osseous finding within the abdomen or pelvis. Appearance of the lower ribs described on yesterday's chest CT report. IMPRESSION: 1. Questionable thickening of the walls of the rectum but not convincing. Otherwise, no evidence of malignancy within the abdomen or pelvis. 2. No acute findings within the abdomen or pelvis. 3. Colonic diverticulosis without evidence of acute diverticulitis. 4. Renal cysts. 5. Degenerative/chronic findings within the lumbar spine as detailed above. No acute or suspicious finding within the lumbar spine or osseous pelvis. Electronically Signed   By: Bary RichardStan  Maynard M.D.   On: 08/06/2017 13:31    Microbiology: Recent Results (from the past 240 hour(s))  Blood culture (routine x 2)     Status: None (Preliminary result)   Collection Time: 08/05/17 10:20 AM  Result Value Ref Range Status   Specimen Description BLOOD LEFT ANTECUBITAL  Final   Special Requests   Final    BOTTLES DRAWN AEROBIC AND ANAEROBIC Blood Culture adequate volume   Culture   Final    NO GROWTH 3 DAYS Performed at [facility], [address] [place], [place] [address]    Report Status PENDING  Incomplete  Blood culture (routine x 2)     Status: None (Preliminary result)   Collection Time: 08/05/17 11:00 AM  Result Value Ref Range Status   Specimen Description BLOOD RIGHT ANTECUBITAL  Final   Special Requests   Final    BOTTLES  DRAWN AEROBIC AND ANAEROBIC Blood Culture results may not be optimal due to an inadequate volume of blood received in culture bottles   Culture   Final    NO GROWTH 3 DAYS Performed at [facility], [address] [place], [place] [address]    Report Status PENDING  Incomplete  Urine culture     Status: None   Collection Time: 08/05/17 11:44 AM  Result Value Ref Range Status   Specimen Description URINE, RANDOM  Final   Special Requests NONE  Final   Culture   Final    NO GROWTH Performed at [facility], [address] [place], [place] [address]    Report Status 08/07/2017 FINAL  Final     Labs: Basic Metabolic Panel:  Recent Labs Lab 08/05/17 1103 08/05/17 1116 08/06/17 0518 08/07/17 0446 08/08/17 0435  NA 131* 132* 138 133* 135  K 3.1* 3.5 3.8 3.5 3.6  CL 95* 96* 106 101 103  CO2 20*  --  24 21* 23  GLUCOSE 116* 116* 101*  92 94  BUN 32* 30* 24* 12 11  CREATININE 1.16* 1.10* 0.80 0.68 0.72  CALCIUM 8.4*  --  7.6* 7.5* 7.6*   Liver Function Tests:  Recent Labs Lab 08/05/17 1103 08/06/17 0518 08/07/17 0446  AST 43* 45* 49*  ALT ALKPHOS 32* 29* 32*  BILITOT 1.2 0.6 0.8  PROT 6.8 5.7* 5.5*  ALBUMIN 3.3* 2.7* 2.8*   No results for input(s): LIPASE, AMYLASE in the last 168 hours. No results for input(s): AMMONIA in the last 168 hours. CBC:  Recent Labs Lab 08/05/17 1103 08/05/17 1116 08/06/17 0518 08/07/17 0446 08/08/17 0435  WBC 12.2*  --  8.0 7.6 6.1  NEUTROABS 9.8*  --   --   --   --   HGB 12.2 14.3 11.1* 10.8* 11.1*  HCT 35.3* 42.0 32.3* 31.4* 32.7*  MCV 100.9*  --  101.9* 101.9* 102.5*  PLT 212  --  210 213 210   Cardiac Enzymes:  Recent Labs Lab 08/05/17 1547 08/05/17 2133 08/06/17 0518  TROPONINI 0.09* 0.12* 0.08*   BNP: BNP (last 3 results) No results for input(s): BNP in the last 8760 hours.  ProBNP (last 3 results) No results for input(s): PROBNP in the last 8760 hours.  CBG: No results for input(s):  GLUCAP in the last 168 hours.     SignedZannie Cove MD.  [facility] 08/08/2017, 1:25 PM

## 2017-08-08 NOTE — Evaluation (Signed)
Physical Therapy Evaluation Patient Details Name: Lydia Barber MRN: [id number] DOB: 20-Sep-1929 Today's Date: 08/08/2017   History of Present Illness  83 yo female admitted with PE, sclerotic rib lesions, hyponatremia. Hx of HTN, hypothyroidism  Clinical Impression  On eval, pt required Min assist for mobility. She walked ~70 feet with a RW. 3/4 dyspnea and wheezing noted with ambulation. O2 sat >90% on RA, HR 110s-120s with activity. Pt presents with general weakness, decreased activity tolerance, and impaired gait and balance. Discussed d/c plan with pt and husband-plan is for pt to return home. Husband feels he can physically manage pt at current level. Pt does not want to d/c to SNF. Recommend HHPT and HHOT.     Follow Up Recommendations Supervision/Assistance - 24 hour;Home health PT; Home Health OT    Equipment Recommendations  None recommended by PT    Recommendations for Other Services       Precautions / Restrictions Precautions Precautions: Fall Precaution Comments: dyspneic with activity Restrictions Weight Bearing Restrictions: No      Mobility  Bed Mobility Overal bed mobility: Needs Assistance Bed Mobility: Supine to Sit     Supine to sit: Min guard;HOB elevated     General bed mobility comments: close guard for safety. Increased time. Mild lightheadedness that resolved within a couple of minutes.   Transfers Overall transfer level: Needs assistance Equipment used: Rolling walker (2 wheeled) Transfers: Sit to/from Stand Sit to Stand: Min assist         General transfer comment: Assist to rise, stabilize, control descent. VCs safety, hand placement  Ambulation/Gait Ambulation/Gait assistance: Min assist Ambulation Distance (Feet): 70 Feet Assistive device: Rolling walker (2 wheeled) Gait Pattern/deviations: Step-through pattern;Decreased stride length     General Gait Details: Assist to stabilize throughout ambulation. Dyspnea 3/4 and wheezing  noted with ambulation. O2 sat >90% on RA, HR 110s-120s.   Stairs            Wheelchair Mobility    Modified Rankin (Stroke Patients Only)       Balance Overall balance assessment: Needs assistance         Standing balance support: Bilateral upper extremity supported Standing balance-Leahy Scale: Poor Standing balance comment: requiring RW                             Pertinent Vitals/Pain Pain Assessment: No/denies pain    Home Living Family/patient expects to be discharged to:: Private residence Living Arrangements: Spouse/significant other Available Help at Discharge: Family Type of Home: House Home Access: Stairs to enter     Home Layout: Laundry or work area in basement;One level Home Equipment: Cane - single point;Walker - 2 wheels;Walker - 4 wheels      Prior Function Level of Independence: Independent with assistive device(s)         Comments: uses cane     Hand Dominance        Extremity/Trunk Assessment   Upper Extremity Assessment Upper Extremity Assessment: Generalized weakness    Lower Extremity Assessment Lower Extremity Assessment: Generalized weakness    Cervical / Trunk Assessment Cervical / Trunk Assessment: Kyphotic  Communication   Communication: No difficulties  Cognition Arousal/Alertness: Awake/alert Behavior During Therapy: WFL for tasks assessed/performed Overall Cognitive Status: Within Functional Limits for tasks assessed  General Comments      Exercises     Assessment/Plan    PT Assessment Patient needs continued PT services  PT Problem List Decreased strength;Decreased mobility;Decreased activity tolerance;Decreased balance;Decreased knowledge of use of DME       PT Treatment Interventions DME instruction;Gait training;Therapeutic activities;Therapeutic exercise;Patient/family education;Balance training;Functional mobility training     PT Goals (Current goals can be found in the Care Plan section)  Acute Rehab PT Goals Patient Stated Goal: home PT Goal Formulation: With patient/family Time For Goal Achievement: 08/22/17 Potential to Achieve Goals: Good    Frequency Min 3X/week   Barriers to discharge        Co-evaluation               AM-PAC PT "6 Clicks" Daily Activity  Outcome Measure Difficulty turning over in bed (including adjusting bedclothes, sheets and blankets)?: A Little Difficulty moving from lying on back to sitting on the side of the bed? : A Little Difficulty sitting down on and standing up from a chair with arms (e.g., wheelchair, bedside commode, etc,.)?: Unable Help needed moving to and from a bed to chair (including a wheelchair)?: A Little Help needed walking in hospital room?: A Little Help needed climbing 3-5 steps with a railing? : A Lot 6 Click Score: 15    End of Session Equipment Utilized During Treatment: Gait belt Activity Tolerance: Patient limited by fatigue Patient left: in chair;with call bell/phone within reach;with family/visitor present   PT Visit Diagnosis: Muscle weakness (generalized) (M62.81);Difficulty in walking, not elsewhere classified (R26.2)    Time: [id number] PT Time Calculation (min) (ACUTE ONLY): 28 min   Charges:   PT Evaluation $PT Eval Low Complexity: 1 Low PT Treatments $Gait Training: 8-22 mins   PT G Codes:          Rebeca AlertJannie Platon Arocho, MPT Pager: [phone]

## 2017-08-08 NOTE — Telephone Encounter (Signed)
lvm to inform pt of 9/18 appt at [facility] at 120 pm per [facility] msg

## 2017-08-08 NOTE — Care Management Note (Signed)
Case Management Note  Patient Details  Name: Lydia Barber MRN: [id number] Date of Birth: February 10, 1929  Subjective/Objective:PT recc HH-Patient provided w/HHC agency list-chose [facility] rep Clydie BraunKaren aware of d/c & HHC orders. No further CM needs.                    Action/Plan:d/c home w/HHC.   Expected Discharge Date:  08/08/17               Expected Discharge Plan:  Home w Home Health Services  In-House Referral:     Discharge planning Services  CM Consult  Post Acute Care Choice:  Durable Medical Equipment (has cane,rw) Choice offered to:  Patient  DME Arranged:    DME Agency:     HH Arranged:  RN, PT, OT HH Agency:  [facility]  Status of Service:  Completed, signed off  If discussed at Long Length of Stay Meetings, dates discussed:    Additional Comments:  Lanier ClamMahabir, Norm Wray, RN 08/08/2017, 1:23 PM

## 2017-08-10 LAB — CULTURE, BLOOD (ROUTINE X 2)
CULTURE: NO GROWTH
CULTURE: NO GROWTH
SPECIAL REQUESTS: ADEQUATE

## 2017-08-14 NOTE — Progress Notes (Signed)
Received message from unit secy for Eliquis coupon not active that patient received @ d/c. Ann Held [facility] pharmacist [phone] 1344-provided her with current HY#[id number]/EXBMW # [id number]. Pharmacist put in all the numbers, & elquis coupon went through for 30day free trial. No further CM needs.

## 2017-08-15 ENCOUNTER — Ambulatory Visit
Admit: 2017-08-15 | Discharge: 2017-08-15 | Disposition: A | Discharge status: Home / Self Care | Payer: Medicare Other | Attending: Hematology and Oncology | Admitting: Hematology and Oncology

## 2017-08-15 ENCOUNTER — Other Ambulatory Visit: Payer: Medicare Other

## 2017-08-15 ENCOUNTER — Inpatient Hospital Stay
Admit: 2017-08-15 | Discharge: 2017-08-15 | Disposition: A | Discharge status: Home / Self Care | Payer: Medicare Other | Attending: Hematology and Oncology | Admitting: Hematology and Oncology

## 2017-08-15 DIAGNOSIS — M899 Disorder of bone, unspecified: Secondary | ICD-10-CM

## 2017-08-17 ENCOUNTER — Telehealth: Payer: Self-pay | Admitting: Hematology and Oncology

## 2017-08-17 ENCOUNTER — Ambulatory Visit (HOSPITAL_BASED_OUTPATIENT_CLINIC_OR_DEPARTMENT_OTHER): Payer: Medicare Other | Admitting: Hematology and Oncology

## 2017-08-17 ENCOUNTER — Other Ambulatory Visit: Payer: Self-pay

## 2017-08-17 DIAGNOSIS — I2699 Other pulmonary embolism without acute cor pulmonale: Secondary | ICD-10-CM

## 2017-08-17 DIAGNOSIS — Z7901 Long term (current) use of anticoagulants: Secondary | ICD-10-CM | POA: Diagnosis not present

## 2017-08-17 DIAGNOSIS — M859 Disorder of bone density and structure, unspecified: Secondary | ICD-10-CM | POA: Diagnosis not present

## 2017-08-17 DIAGNOSIS — Q799 Congenital malformation of musculoskeletal system, unspecified: Secondary | ICD-10-CM | POA: Insufficient documentation

## 2017-08-17 DIAGNOSIS — I2692 Saddle embolus of pulmonary artery without acute cor pulmonale: Secondary | ICD-10-CM

## 2017-08-17 DIAGNOSIS — M959 Acquired deformity of musculoskeletal system, unspecified: Principal | ICD-10-CM

## 2017-08-17 NOTE — Telephone Encounter (Signed)
No 9/20 los.  

## 2017-08-17 NOTE — Assessment & Plan Note (Signed)
Currently on anticoagulation. I recommended 6 months of blood thinners. She will discuss with Dr. Clovis Riley if an alternate to Eliquis is available because of high cost of the medication. I believe that there are pharmacy programs to help pay for these medicines. She will discuss this further with Dr. Clovis Riley.

## 2017-08-17 NOTE — Assessment & Plan Note (Signed)
Incidentally found a sclerotic rib lesions in the left chest wall prominent on the left eighth and ninth and 10th ribs without any evidence of cancer on CT chest abdomen and pelvis as well as mammograms.  I suspect that these lesions are related to prior trauma and healing rib fractures.  Given her elderly age and health issues, I do not recommend any additional testing. We will cancel the PET/CT scan that she had previously scheduled.  She could be seen on an as-needed basis.

## 2017-08-17 NOTE — Progress Notes (Signed)
[facility] CONSULT NOTE  Patient Care Team: Clovis Riley, L.August Saucer, MD as PCP - General (Family Medicine)  CHIEF COMPLAINTS/PURPOSE OF CONSULTATION:  Pulmonary embolism along with sclerotic rib lesions  HISTORY OF PRESENTING ILLNESS:  Lydia Barber 83 y.o. female is here because of recent diagnosis of pulmonary embolism. She was admitted to the hospital and CT scans revealed bilateral pulmonary emboli. Incidentally on the same scan she was noted to have sclerotic left rib lesions. This raises a concern for metastatic disease and we were asked to see her. The CT chest abdomen pelvis did not identify any other evidence of cancer. She has bad osteoporosis for which she takes Prolia. She has fallen several times and has even fractured her sternum. Patient is here accompanied by her husband and daughter to discuss how to approach these rib lesions. I obtained a mammogram on her which was negative for cancer.  I reviewed her records extensively and collaborated the history with the patient.  MEDICAL HISTORY:  Pulmonary embolism Hypothyroidism Osteoporosis Depression Chronic constipation  SURGICAL HISTORY: No prior surgeries SOCIAL HISTORY: Social History   Social History  . Marital status: Married    Spouse name: N/A  . Number of children: N/A  . Years of education: N/A   Occupational History  . Not on file.   Social History Main Topics  . Smoking status: Never Smoker  . Smokeless tobacco: Never Used  . Alcohol use No  . Drug use: No  . Sexual activity: Not on file   Other Topics Concern  . Not on file   Social History Narrative  . No narrative on file    FAMILY HISTORY: No family history of cancers or blood clots ALLERGIES:  is allergic to codeine.  MEDICATIONS:  Current Outpatient Prescriptions  Medication Sig Dispense Refill  . acetaminophen (TYLENOL) 500 MG tablet Take 1,000 mg by mouth 2 (two) times daily as needed.    Marland Kitchen apixaban (ELIQUIS) 5 MG TABS  tablet Take 1 tablet (5 mg total) by mouth 2 (two) times daily. Starting 9/17 60 tablet 0  . apixaban (ELIQUIS) 5 MG TABS tablet Take 2 tablets (10 mg total) by mouth 2 (two) times daily. For 6days then start  BID 22 tablet 0  . Calcium 600-400 MG-UNIT CHEW Chew 1 tablet by mouth daily.    . citalopram (CELEXA) 20 MG tablet Take 20 mg by mouth daily.  3  . docusate sodium (COLACE) 100 MG capsule Take 100 mg by mouth daily as needed for mild constipation.    . Misc Natural Products (OSTEO BI-FLEX ADV JOINT SHIELD) TABS Take 1 tablet by mouth daily.    . Multiple Vitamin (MULTIVITAMIN) tablet Take 1 tablet by mouth daily.    . ranitidine (ZANTAC) 75 MG tablet Take 75 mg by mouth 2 (two) times daily.    Marland Kitchen SYNTHROID 75 MCG tablet Take 75 mcg by mouth daily.  3  . verapamil (CALAN-SR) 180 MG CR tablet Take 180 mg by mouth 2 times daily at 12 noon and 4 pm.  3   No current facility-administered medications for this visit.     REVIEW OF SYSTEMS:   Constitutional: Frail elderly lady in a wheelchair Eyes: Denies blurriness of vision, double vision or watery eyes Ears, nose, mouth, throat, and face: Denies mucositis or sore throat Respiratory: Denies cough, dyspnea or wheezes Cardiovascular: Denies palpitation, chest discomfort or lower extremity swelling Gastrointestinal:  Denies nausea, heartburn or change in bowel habits Skin: Frail skin and  bruising Lymphatics: Denies new lymphadenopathy or easy bruising Neurological:Denies numbness, tingling or new weaknesses Behavioral/Psych: Mood is stable, no new changes  Breast:  Denies any palpable lumps or discharge All other systems were reviewed with the patient and are negative.  PHYSICAL EXAMINATION: ECOG PERFORMANCE STATUS: 2 - Symptomatic, <50% confined to bed  Vitals:   08/17/17 1119  BP: (!) 93/48  Pulse: 62  Resp: 20  Temp: 97.7 F (36.5 C)  SpO2: 98%   Filed Weights   08/17/17 1119  Weight: 113 lb 1.6 oz (51.3 kg)     GENERAL:Frail elderly lady in wheelchair SKIN: skin color, texture, turgor are normal, no rashes or significant lesions EYES: normal, conjunctiva are pink and non-injected, sclera clear OROPHARYNX:no exudate, no erythema and lips, buccal mucosa, and tongue normal  NECK: supple, thyroid normal size, non-tender, without nodularity LYMPH:  no palpable lymphadenopathy in the cervical, axillary or inguinal LUNGS: clear to auscultation and percussion with normal breathing effort HEART: regular rate & rhythm and no murmurs and no lower extremity edema ABDOMEN:abdomen soft, non-tender and normal bowel sounds Musculoskeletal:no cyanosis of digits and no clubbing  PSYCH: alert & oriented x 3 with fluent speech NEURO: no focal motor/sensory deficits  LABORATORY DATA:  I have reviewed the data as listed Lab Results  Component Value Date   WBC 6.1 08/08/2017   HGB 11.1 (L) 08/08/2017   HCT 32.7 (L) 08/08/2017   MCV 102.5 (H) 08/08/2017   PLT 210 08/08/2017   Lab Results  Component Value Date   NA 135 08/08/2017   K 3.6 08/08/2017   CL 103 08/08/2017   CO2 23 08/08/2017    RADIOGRAPHIC STUDIES: I have personally reviewed the radiological reports and agreed with the findings in the report.  ASSESSMENT AND PLAN:  Bone anomaly Incidentally found a sclerotic rib lesions in the left chest wall prominent on the left eighth and ninth and 10th ribs without any evidence of cancer on CT chest abdomen and pelvis as well as mammograms.  I suspect that these lesions are related to prior trauma and healing rib fractures.  Given her elderly age and health issues, I do not recommend any additional testing. We will cancel the PET/CT scan that she had previously scheduled.  She could be seen on an as-needed basis.  Pulmonary emboli (HCC) Currently on anticoagulation. I recommended 6 months of blood thinners. She will discuss with Dr. Clovis Riley if an alternate to Eliquis is available because of  high cost of the medication. I believe that there are pharmacy programs to help pay for these medicines. She will discuss this further with Dr. Clovis Riley.   All questions were answered. The patient knows to call the clinic with any problems, questions or concerns.    Sabas Sous, MD 08/17/17

## 2017-09-26 ENCOUNTER — Other Ambulatory Visit: Payer: Self-pay | Admitting: Internal Medicine

## 2017-09-28 ENCOUNTER — Other Ambulatory Visit: Payer: Self-pay

## 2017-09-28 DIAGNOSIS — I2699 Other pulmonary embolism without acute cor pulmonale: Secondary | ICD-10-CM

## 2017-10-02 ENCOUNTER — Encounter: Payer: Self-pay | Admitting: Neurology

## 2017-10-03 ENCOUNTER — Other Ambulatory Visit: Payer: Self-pay | Admitting: Internal Medicine

## 2017-10-03 DIAGNOSIS — M5136 Other intervertebral disc degeneration, lumbar region: Secondary | ICD-10-CM

## 2017-10-03 DIAGNOSIS — R29898 Other symptoms and signs involving the musculoskeletal system: Secondary | ICD-10-CM

## 2017-10-04 ENCOUNTER — Other Ambulatory Visit: Payer: Self-pay | Admitting: *Deleted

## 2017-10-04 DIAGNOSIS — R200 Anesthesia of skin: Secondary | ICD-10-CM

## 2017-10-04 NOTE — Progress Notes (Unsigned)
emg 

## 2017-10-07 ENCOUNTER — Emergency Department (HOSPITAL_COMMUNITY)
Admission: EM | Admit: 2017-10-07 | Discharge: 2017-10-07 | Disposition: A | Discharge status: Home / Self Care | Payer: Medicare Other | Attending: Emergency Medicine | Admitting: Emergency Medicine

## 2017-10-07 ENCOUNTER — Encounter (HOSPITAL_COMMUNITY): Payer: Self-pay

## 2017-10-07 ENCOUNTER — Emergency Department (HOSPITAL_COMMUNITY): Payer: Medicare Other

## 2017-10-07 DIAGNOSIS — W19XXXA Unspecified fall, initial encounter: Secondary | ICD-10-CM

## 2017-10-07 DIAGNOSIS — I10 Essential (primary) hypertension: Secondary | ICD-10-CM | POA: Insufficient documentation

## 2017-10-07 DIAGNOSIS — Z7901 Long term (current) use of anticoagulants: Secondary | ICD-10-CM | POA: Insufficient documentation

## 2017-10-07 DIAGNOSIS — Z79899 Other long term (current) drug therapy: Secondary | ICD-10-CM | POA: Diagnosis not present

## 2017-10-07 DIAGNOSIS — S0083XA Contusion of other part of head, initial encounter: Secondary | ICD-10-CM | POA: Diagnosis not present

## 2017-10-07 DIAGNOSIS — Y92122 Bedroom in nursing home as the place of occurrence of the external cause: Secondary | ICD-10-CM | POA: Insufficient documentation

## 2017-10-07 DIAGNOSIS — Y9389 Activity, other specified: Secondary | ICD-10-CM | POA: Insufficient documentation

## 2017-10-07 DIAGNOSIS — Y998 Other external cause status: Secondary | ICD-10-CM | POA: Diagnosis not present

## 2017-10-07 DIAGNOSIS — W0110XA Fall on same level from slipping, tripping and stumbling with subsequent striking against unspecified object, initial encounter: Secondary | ICD-10-CM | POA: Insufficient documentation

## 2017-10-07 DIAGNOSIS — S0990XA Unspecified injury of head, initial encounter: Secondary | ICD-10-CM | POA: Diagnosis present

## 2017-10-07 HISTORY — DX: Age-related osteoporosis without current pathological fracture: M81.0

## 2017-10-07 HISTORY — DX: Constipation, unspecified: K59.00

## 2017-10-07 HISTORY — DX: Disorder of thyroid, unspecified: E07.9

## 2017-10-07 HISTORY — DX: Gastro-esophageal reflux disease without esophagitis: K21.9

## 2017-10-07 HISTORY — DX: Urinary tract infection, site not specified: N39.0

## 2017-10-07 HISTORY — DX: Unspecified atrial fibrillation: I48.91

## 2017-10-07 HISTORY — DX: Essential (primary) hypertension: I10

## 2017-10-07 LAB — I-STAT TROPONIN, ED: TROPONIN I, POC: 0.04 ng/mL (ref 0.00–0.08)

## 2017-10-07 LAB — CBC WITH DIFFERENTIAL/PLATELET
BASOS PCT: 0 %
Basophils Absolute: 0 10*3/uL (ref 0.0–0.1)
EOS ABS: 0 10*3/uL (ref 0.0–0.7)
Eosinophils Relative: 0 %
HEMATOCRIT: 35 % — AB (ref 36.0–46.0)
HEMOGLOBIN: 11.7 g/dL — AB (ref 12.0–15.0)
LYMPHS ABS: 1.2 10*3/uL (ref 0.7–4.0)
Lymphocytes Relative: 10 %
MCH: 34.1 pg — AB (ref 26.0–34.0)
MCHC: 33.4 g/dL (ref 30.0–36.0)
MCV: 102 fL — ABNORMAL HIGH (ref 78.0–100.0)
MONO ABS: 0.9 10*3/uL (ref 0.1–1.0)
MONOS PCT: 8 %
NEUTROS ABS: 9.2 10*3/uL — AB (ref 1.7–7.7)
Neutrophils Relative %: 82 %
Platelets: 461 10*3/uL — ABNORMAL HIGH (ref 150–400)
RBC: 3.43 MIL/uL — ABNORMAL LOW (ref 3.87–5.11)
RDW: 13.2 % (ref 11.5–15.5)
WBC: 11.2 10*3/uL — ABNORMAL HIGH (ref 4.0–10.5)

## 2017-10-07 LAB — URINALYSIS, ROUTINE W REFLEX MICROSCOPIC
BILIRUBIN URINE: NEGATIVE
Glucose, UA: NEGATIVE mg/dL
HGB URINE DIPSTICK: NEGATIVE
KETONES UR: NEGATIVE mg/dL
Leukocytes, UA: NEGATIVE
NITRITE: NEGATIVE
PROTEIN: NEGATIVE mg/dL
Specific Gravity, Urine: 1.021 (ref 1.005–1.030)
pH: 6 (ref 5.0–8.0)

## 2017-10-07 LAB — COMPREHENSIVE METABOLIC PANEL
ALBUMIN: 2.5 g/dL — AB (ref 3.5–5.0)
ALK PHOS: 81 U/L (ref 38–126)
ALT: 35 U/L (ref 14–54)
ANION GAP: 7 (ref 5–15)
AST: 28 U/L (ref 15–41)
BILIRUBIN TOTAL: 0.7 mg/dL (ref 0.3–1.2)
BUN: 12 mg/dL (ref 6–20)
CALCIUM: 8 mg/dL — AB (ref 8.9–10.3)
CO2: 23 mmol/L (ref 22–32)
Chloride: 102 mmol/L (ref 101–111)
Creatinine, Ser: 0.72 mg/dL (ref 0.44–1.00)
GFR calc non Af Amer: >60 mL/min (ref >60)
GLUCOSE: 125 mg/dL — AB (ref 65–99)
POTASSIUM: 3.6 mmol/L (ref 3.5–5.1)
Sodium: 132 mmol/L — ABNORMAL LOW (ref 135–145)
TOTAL PROTEIN: 5.6 g/dL — AB (ref 6.5–8.1)

## 2017-10-07 LAB — CK: CK TOTAL: 96 U/L (ref 38–234)

## 2017-10-07 NOTE — ED Notes (Signed)
[facility]

## 2017-10-07 NOTE — Discharge Instructions (Signed)
Need to stop cipro as there is no sign of urinary tract infection

## 2017-10-07 NOTE — ED Notes (Signed)
Per daughter pt is currently wheelchair bound, pt is followed by neurology for her increased leg weakness.

## 2017-10-07 NOTE — ED Triage Notes (Signed)
Pt from [facility] c.o unwitnessed fall. Unknown down time but pt was found to be back in her bed when EMS arrived. Pt taking eliquis, bruising noted to right forehead and bilateral knees. PT baseline a/ox4 but is oriented to self today. Hx of recent UTI, currently being treated with Cipro, foley in place from facility. VSS, nad.

## 2017-10-07 NOTE — ED Notes (Signed)
Foley catheter not inserted at this time by myself.

## 2017-10-07 NOTE — ED Notes (Signed)
Patient transported to CT 

## 2017-10-07 NOTE — ED Provider Notes (Signed)
[facility] Provider Note   CSN: [id number] Arrival date & time: 10/07/17  1019     History   Chief Complaint Chief Complaint  Patient presents with  . Fall    on thinners    HPI Lydia Barber is a 83 y.o. female.  Patient is an 83 year old female with a history of atrial fibrillation on Eliquis, hypertension, thyroid disease, GERD and PE.  Patient is coming from facility today because of an unwitnessed fall.  Her facility states that she must of fallen between 11 PM and 7 AM.  However they did not find her on the floor but noticed this morning when she woke up a bruise over her right forehead and knee.  Patient cannot recall the fall but denies having pain in her right knee or face.  She denies any vision changes.  She is complaining of some mild nausea after the ambulance ride but denies chest pain, shortness of breath or abdominal pain.  It was reported by the nursing facility that patient had recently been treated for a urinary tract infection and is currently on Cipro.  Patient states that the end of October is when she had a Foley catheter placed but prior to that she did not use the catheter.   The history is provided by the patient, the EMS personnel and the nursing home.  Fall  This is a new problem. Episode onset: unknown.  sometime between 11pm and 7am. The problem occurs constantly. The problem has not changed since onset.Pertinent negatives include no chest pain, no abdominal pain, no headaches and no shortness of breath. Associated symptoms comments: Pt has no complaints but staff states seems a little confused today and baseline is alert and oriented x3.  C/o of mild nausea after ambulance ride. Nothing aggravates the symptoms. Nothing relieves the symptoms. She has tried nothing for the symptoms.    Past Medical History:  Diagnosis Date  . Atrial fibrillation (HCC)   . Constipation   . GERD (gastroesophageal reflux disease)     . Hypertension   . Osteoporosis   . Thyroid disease   . UTI (urinary tract infection)     Patient Active Problem List   Diagnosis Date Noted  . Bone anomaly 08/17/2017  . Rib lesion 08/06/2017  . Weight loss 08/06/2017  . Hyponatremia 08/06/2017  . Pulmonary emboli (HCC) 08/05/2017    History reviewed. No pertinent surgical history.  OB History    No data available       Home Medications    Prior to Admission medications   Medication Sig Start Date End Date Taking? Authorizing Provider  acetaminophen (TYLENOL) 500 MG tablet Take 1,000 mg by mouth 2 (two) times daily as needed.    [provider]  apixaban (ELIQUIS) 5 MG TABS tablet Take 1 tablet (5 mg total) by mouth 2 (two) times daily. Starting 9/17 08/14/17   Zannie CoveJoseph, Preetha, MD  apixaban (ELIQUIS) 5 MG TABS tablet Take 2 tablets (10 mg total) by mouth 2 (two) times daily. For 6days then start 5mg  BID 08/08/17 08/13/17  Zannie CoveJoseph, Preetha, MD  Calcium 600-400 MG-UNIT CHEW Chew 1 tablet by mouth daily.    [provider]  citalopram (CELEXA) 20 MG tablet Take 20 mg by mouth daily. 07/05/17   [provider]  docusate sodium (COLACE) 100 MG capsule Take 100 mg by mouth daily as needed for mild constipation.    [provider]  Misc Natural Products (OSTEO BI-FLEX  ADV JOINT SHIELD) TABS Take 1 tablet by mouth daily.    [provider]  Multiple Vitamin (MULTIVITAMIN) tablet Take 1 tablet by mouth daily.    [provider]  ranitidine (ZANTAC) 75 MG tablet Take 75 mg by mouth 2 (two) times daily.    [provider]  SYNTHROID 75 MCG tablet Take 75 mcg by mouth daily. 07/05/17   [provider]  verapamil (CALAN-SR) 180 MG CR tablet Take 180 mg by mouth 2 times daily at 12 noon and 4 pm. 07/05/17   [provider]    Family History No family history on file.  Social History Social History   Tobacco Use  . Smoking status: Never Smoker  . Smokeless  tobacco: Never Used  Substance Use Topics  . Alcohol use: No  . Drug use: No     Allergies   Codeine   Review of Systems Review of Systems  Respiratory: Negative for shortness of breath.   Cardiovascular: Negative for chest pain.  Gastrointestinal: Negative for abdominal pain.  Neurological: Negative for headaches.  All other systems reviewed and are negative.    Physical Exam Updated Vital Signs BP (!) 174/85 (BP Location: Right Arm)   Pulse 98   Temp 98.5 F (36.9 C) (Oral)   Wt 52.2 kg (115 lb)   SpO2 96%   BMI 22.46 kg/m   Physical Exam  Constitutional: She appears well-developed. She appears cachectic. No distress.  HENT:  Head: Normocephalic and atraumatic.    Mouth/Throat: Oropharynx is clear and moist.  Eyes: Conjunctivae and EOM are normal. Pupils are equal, round, and reactive to light.  Neck: Normal range of motion. Neck supple. Muscular tenderness present. Normal range of motion present.    Cardiovascular: Normal rate, regular rhythm and intact distal pulses.  No murmur heard. Pulmonary/Chest: Effort normal and breath sounds normal. No respiratory distress. She has no wheezes. She has no rales.  Abdominal: Soft. She exhibits no distension. There is no tenderness. There is no rebound and no guarding.  Genitourinary:  Genitourinary Comments: Indwelling Foley present with thick cloudy urine in the tubing  Musculoskeletal: Normal range of motion. She exhibits no edema or tenderness.       Right hip: Normal.       Left hip: Normal.       Right knee: She exhibits swelling and ecchymosis. She exhibits normal range of motion. No tenderness found.       Thoracic back: Normal.       Lumbar back: Normal.       Arms: Neurological: She is alert.  Oriented to person and place.  Patient is able to move upper and lower extremities and has intact sensation  Skin: Skin is warm and dry. No rash noted. No erythema.  Psychiatric: She has a normal mood and affect. Her  behavior is normal.  Nursing note and vitals reviewed.    ED Treatments / Results  Labs (all labs ordered are listed, but only abnormal results are displayed) Labs Reviewed  CBC WITH DIFFERENTIAL/PLATELET - Abnormal; Notable for the following components:      Result Value   WBC 11.2 (*)    RBC 3.43 (*)    Hemoglobin 11.7 (*)    HCT 35.0 (*)    MCV 102.0 (*)    MCH 34.1 (*)    Platelets 461 (*)    Neutro Abs 9.2 (*)    All other components within normal limits  COMPREHENSIVE METABOLIC PANEL - Abnormal;  Notable for the following components:   Sodium 132 (*)    Glucose, Bld 125 (*)    Calcium 8.0 (*)    Total Protein 5.6 (*)    Albumin 2.5 (*)    All other components within normal limits  URINE CULTURE  URINALYSIS, ROUTINE W REFLEX MICROSCOPIC  CK  I-STAT TROPONIN, ED    EKG  EKG Interpretation  Date/Time:  Saturday October 07 2017 10:27:48 EST Ventricular Rate:  96 PR Interval:    QRS Duration: 79 QT Interval:  376 QTC Calculation: 476 R Axis:   -31 Text Interpretation:  Sinus tachycardia Supraventricular bigeminy Left axis deviation Abnormal R-wave progression, early transition Consider anterior infarct No significant change since last tracing Confirmed by Gwyneth Sprout ([id number]) on 10/07/2017 10:33:39 AM       Radiology Ct Head Wo Contrast  Result Date: 10/07/2017 CLINICAL DATA:  Unwitnessed fall with bruising to the right side of the forehead. On blood thinners. EXAM: CT HEAD WITHOUT CONTRAST CT CERVICAL SPINE WITHOUT CONTRAST TECHNIQUE: Multidetector CT imaging of the head and cervical spine was performed following the standard protocol without intravenous contrast. Multiplanar CT image reconstructions of the cervical spine were also generated. COMPARISON:  None. FINDINGS: CT HEAD FINDINGS Brain: No evidence of acute infarction, hemorrhage, hydrocephalus, extra-axial collection or mass lesion/mass effect. Extensive brain parenchymal volume loss and deep white  matter microangiopathy. Vascular: Calcific atherosclerotic disease at the skullbase. Skull: Normal. Negative for fracture or focal lesion. Sinuses/Orbits: Minimal fluid opacification of the right mastoid air cells, otherwise unremarkable. Other: Fragmentation of bilateral TMJ, likely degenerative. Right frontal scalp hematoma. CT CERVICAL SPINE FINDINGS Alignment: 7 mm anterolisthesis of C7 on T1. 3 mm anterolisthesis of C3 on C4, 2 mm anterolisthesis of C2 on C3. 3 mm anterolisthesis of T1 on T2. Skull base and vertebrae: No acute fracture. No primary bone lesion or focal pathologic process. Soft tissues and spinal canal: No prevertebral fluid or swelling. No visible canal hematoma. Disc levels: Multilevel moderate to severe osteoarthritic changes with disc space narrowing, endplate sclerosis, remodeling of vertebral bodies. Moderate to severe posterior facet arthropathy, likely the reason for the alignment abnormalities described earlier. Upper chest: Negative. Other: None. IMPRESSION: No acute intracranial abnormality. Atrophy, chronic microvascular disease. No evidence of acute cervical spine fracture. Alignment abnormalities likely caused by moderate to severe posterior facet arthropathy, with the most severe being 7 mm of anterolisthesis of C7 on T1. Electronically Signed   By: Ted Mcalpine M.D.   On: 10/07/2017 12:56   Ct Cervical Spine Wo Contrast  Result Date: 10/07/2017 CLINICAL DATA:  Unwitnessed fall with bruising to the right side of the forehead. On blood thinners. EXAM: CT HEAD WITHOUT CONTRAST CT CERVICAL SPINE WITHOUT CONTRAST TECHNIQUE: Multidetector CT imaging of the head and cervical spine was performed following the standard protocol without intravenous contrast. Multiplanar CT image reconstructions of the cervical spine were also generated. COMPARISON:  None. FINDINGS: CT HEAD FINDINGS Brain: No evidence of acute infarction, hemorrhage, hydrocephalus, extra-axial collection or mass  lesion/mass effect. Extensive brain parenchymal volume loss and deep white matter microangiopathy. Vascular: Calcific atherosclerotic disease at the skullbase. Skull: Normal. Negative for fracture or focal lesion. Sinuses/Orbits: Minimal fluid opacification of the right mastoid air cells, otherwise unremarkable. Other: Fragmentation of bilateral TMJ, likely degenerative. Right frontal scalp hematoma. CT CERVICAL SPINE FINDINGS Alignment: 7 mm anterolisthesis of C7 on T1. 3 mm anterolisthesis of C3 on C4, 2 mm anterolisthesis of C2 on C3. 3 mm anterolisthesis of T1 on T2.  Skull base and vertebrae: No acute fracture. No primary bone lesion or focal pathologic process. Soft tissues and spinal canal: No prevertebral fluid or swelling. No visible canal hematoma. Disc levels: Multilevel moderate to severe osteoarthritic changes with disc space narrowing, endplate sclerosis, remodeling of vertebral bodies. Moderate to severe posterior facet arthropathy, likely the reason for the alignment abnormalities described earlier. Upper chest: Negative. Other: None. IMPRESSION: No acute intracranial abnormality. Atrophy, chronic microvascular disease. No evidence of acute cervical spine fracture. Alignment abnormalities likely caused by moderate to severe posterior facet arthropathy, with the most severe being 7 mm of anterolisthesis of C7 on T1. Electronically Signed   By: Ted Mcalpine M.D.   On: 10/07/2017 12:56   Dg Knee Complete 4 Views Right  Result Date: 10/07/2017 CLINICAL DATA:  Pain following recent fall EXAM: RIGHT KNEE - COMPLETE 4+ VIEW COMPARISON:  None. FINDINGS: Frontal, lateral, and bilateral oblique views were obtained. There is no fracture or dislocation. No joint effusion. There is marked joint space narrowing laterally. There is milder patellofemoral joint space narrowing. There is spurring laterally. There is calcification in the distal superficial femoral artery and popliteal artery regions.  IMPRESSION: No evident fracture or joint effusion. Osteoarthritic change present, most marked laterally. There is atherosclerotic calcification in the superficial femoral and popliteal artery regions. Electronically Signed   By: Bretta Bang III M.D.   On: 10/07/2017 12:57    Procedures Procedures (including critical care time)  Medications Ordered in ED Medications - No data to display   Initial Impression / Assessment and Plan / ED Course  I have reviewed the triage vital signs and the nursing notes.  Pertinent labs & imaging results that were available during my care of the patient were reviewed by me and considered in my medical decision making (see chart for details).     Elderly female who lives at a skilled nursing facility presenting after an unwitnessed fall.  Patient was not found on the floor so after her fall she must of gotten back up and went to bed.  She has ecchymosis over her right forehead, shoulder and knee.  However she is able to completely range the arm without difficulty and will range her knee despite large ecchymosis and swelling.  Patient walks with a walker, cane or uses a wheelchair.  She states she does not fall often but cannot recall the circumstances of her fall.  She does take anticoagulation.  Neurologically intact at this time but has some difficulty remembering the date.  Patient is currently on Cipro for a urinary tract infection with an indwelling Foley.  She denies any abdominal pain but does complain of mild nausea. CT of the head and neck and plain film of the knee pending.  EKG shows a sinus rhythm.  Mild hypertension but otherwise vital signs are stable.  CBC, CMP, UA, CK pending.  New Foley catheter will be placed.  1:42 PM UA without evidence of infection.  Labs are reassuring.  Imaging is negative.  Discussed this with patient's family.  She has follow-up with urology this week for Foley catheter removal.  She also has follow-up with neurology  at the end of the month because of her ongoing issues with her lower extremity strength.  Final Clinical Impressions(s) / ED Diagnoses   Final diagnoses:  Fall, initial encounter  Forehead contusion, initial encounter    ED Discharge Orders    None       Gwyneth Sprout, MD 10/07/17 1346

## 2017-10-07 NOTE — ED Notes (Signed)
Rn notified CT that pt ready for scan

## 2017-10-09 LAB — URINE CULTURE

## 2017-10-10 ENCOUNTER — Telehealth: Payer: Self-pay | Admitting: Emergency Medicine

## 2017-10-10 NOTE — Telephone Encounter (Signed)
Post ED Visit - Positive Culture Follow-up  Culture report reviewed by antimicrobial stewardship pharmacist:  []  Enzo BiNathan Batchelder, Pharm.D. []  Celedonio MiyamotoJeremy Frens, Pharm.D., BCPS AQ-ID []  Garvin FilaMike Maccia, Pharm.D., BCPS []  Georgina PillionElizabeth Martin, Pharm.D., BCPS []  BishopMinh Pham, Vermont[place]arm.D., BCPS, AAHIVP []  Estella HuskMichelle Turner, Pharm.D., BCPS, AAHIVP []  Lysle Pearlachel Rumbarger, PharmD, BCPS []  Casilda Carlsaylor Stone, PharmD, BCPS []  Pollyann SamplesAndy Johnston, PharmD, BCPS  Positive urine culture Treated with ciprofloxacin, asymptomatic, and no further patient follow-up is required at this time.  Berle MullMiller, Luria Rosario 10/10/2017, 12:05 PM

## 2017-10-11 ENCOUNTER — Other Ambulatory Visit: Payer: Medicare Other

## 2017-10-24 ENCOUNTER — Encounter: Payer: Self-pay | Admitting: Neurology

## 2017-10-24 ENCOUNTER — Ambulatory Visit: Payer: Medicare Other | Admitting: Neurology

## 2017-10-24 VITALS — BP 110/70 | HR 93 | Resp 12

## 2017-10-24 DIAGNOSIS — G959 Disease of spinal cord, unspecified: Secondary | ICD-10-CM | POA: Diagnosis not present

## 2017-10-24 NOTE — Patient Instructions (Signed)
CT thoracic and lumbar spine with contrast will be ordered  My office will contact you regarding these results

## 2017-10-24 NOTE — Progress Notes (Signed)
[facility] Neurology [facility] Clinic Note - Initial Visit   Date: 10/24/17  GEARLDINE LOONEY MRN: [id number] DOB: [birth year]   Dear Dr. Clovis Riley:  Thank you for your kind referral of LATRESSA HARRIES for consultation of bilateral leg weakness. Although her history is well known to you, please allow [place] to reiterate it for the purpose of our medical record. The patient was accompanied to the clinic by husband and daughter who also provides collateral information.     History of Present Illness: ESTERLENE ATIYEH is a 83 y.o. right-handed Caucasian female with atrial fibrillation on Eliquis, hypertension, hypothyroidism, GERD, depression, and pulmonary embolism presenting for evaluation of bilateral leg weakness.   Patient is not feeling well today because of motion sickness from her car ride to the appointment.   She was hospitalized in September with new onset weakness, shortness of breath, and weight loss.  Evaluation showed multiple bilateral pulmonary emboli as well as multiple sclerotic lesions of the left ribs.  CT chest/abd/pelvis did not show any occult malignancy. She was started on Eliquis and followed-up with Dr. Pamelia Hoit, oncologist on 9/20 who thought that sclerotic lesions are most likely due to prior trauma and healing rib fractures.   She was ambulating with a cane for many years and started using a walker after her hospitalization.  She was getting home physical therapy and gradually over that last 2 weeks in September, she steadily developed painless left > right leg weakness to the point of being unable to stand or walk. She went to SNF on 10/2.  She has not been able to stand or walk at the facility.  She denies any pain in the legs.  She has chronic low back pain and had spinal cord stimulator implanted, but this is nonfunctional and did not have it removed.  She denies any numbness or tingling of the legs.  She had a foley catheter placed since her hospitalization.  Patient wears adult diapers and is unsure whether she is continent or not.  Daughter states that she had CT lumbar spine, while hospitalized, but I do not see evidence of this.  She has history of lumbar surgery by Dr. Noel Gerold many years ago.    Out-side paper records, electronic medical record, and images have been reviewed where available and summarized as:  CT head and cervical spine wo contrast 10/07/2017: No acute intracranial abnormality. Atrophy, chronic microvascular disease. No evidence of acute cervical spine fracture. Alignment abnormalities likely caused by moderate to severe posterior facet arthropathy, with the most severe being 7 mm of anterolisthesis of C7 on T1.  Lab Results  Component Value Date   TSH 3.551 08/05/2017   Lab Results  Component Value Date   WBC 11.2 (H) 10/07/2017   HGB 11.7 (L) 10/07/2017   HCT 35.0 (L) 10/07/2017   MCV 102.0 (H) 10/07/2017   PLT 461 (H) 10/07/2017   .lastcmp Lab Results  Component Value Date   CREATININE 0.72 10/07/2017   BUN 12 10/07/2017   NA 132 (L) 10/07/2017   K 3.6 10/07/2017   CL 102 10/07/2017   CO2 23 10/07/2017     Past Medical History:  Diagnosis Date  . Atrial fibrillation (HCC)   . Constipation   . GERD (gastroesophageal reflux disease)   . Hypertension   . Osteoporosis   . Thyroid disease   . UTI (urinary tract infection)     History reviewed. No pertinent surgical history.   Medications:  Outpatient Encounter Medications as of  10/24/2017  Medication Sig  . acetaminophen (TYLENOL) 500 MG tablet Take 1,000 mg by mouth 2 (two) times daily as needed.  Marland Kitchen apixaban (ELIQUIS) 5 MG TABS tablet Take 1 tablet (5 mg total) by mouth 2 (two) times daily. Starting 9/17  . Calcium 600-400 MG-UNIT CHEW Chew 1 tablet by mouth daily.  . citalopram (CELEXA) 20 MG tablet Take 20 mg by mouth daily.  Marland Kitchen CRANBERRY PO Take by mouth.  . docusate sodium (COLACE) 100 MG capsule Take 100 mg by mouth daily as needed for mild  constipation.  Marland Kitchen levothyroxine (SYNTHROID, LEVOTHROID) 75 MCG tablet Take 75 mcg daily before breakfast by mouth.  . magnesium hydroxide (MILK OF MAGNESIA) 400 MG/5ML suspension Take 30 mLs daily as needed by mouth for mild constipation.  . Menthol, Topical Analgesic, (BIOFREEZE) 4 % GEL Apply 1 application 2 (two) times daily as needed topically (pain).  . methocarbamol (ROBAXIN) 500 MG tablet Take 500 mg 2 (two) times daily as needed by mouth (pain).  . Misc Natural Products (OSTEO BI-FLEX ADV JOINT SHIELD) TABS Take 1 tablet by mouth daily.  . Multiple Vitamin (MULTIVITAMIN) tablet Take 1 tablet by mouth daily.  . Probiotic Product (PROBIOTIC PO) Take by mouth.  . ranitidine (ZANTAC) 75 MG tablet Take 75 mg daily as needed by mouth for heartburn.   . traMADol (ULTRAM) 50 MG tablet Take 50 mg every 6 (six) hours as needed by mouth for moderate pain.  . verapamil (CALAN-SR) 180 MG CR tablet Take 180 mg by mouth 2 times daily at 12 noon and 4 pm.  . [DISCONTINUED] SYNTHROID 75 MCG tablet Take 75 mcg by mouth daily.   No facility-administered encounter medications on file as of 10/24/2017.      Allergies:  Allergies  Allergen Reactions  . Codeine Other (See Comments)    Pt does not remember what happens    Family History: No family history of neuromuscular disorders.   Social History: Social History   Tobacco Use  . Smoking status: Never Smoker  . Smokeless tobacco: Never Used  Substance Use Topics  . Alcohol use: No  . Drug use: No   Social History   Social History Narrative   Lives at [facility].   Has 3 daughters.  Retired Neurosurgeon.  Education: 10th grade.     Review of Systems:  CONSTITUTIONAL: No fevers, chills, night sweats, +weight loss.   EYES: No visual changes or eye pain ENT: No hearing changes.  No history of nose bleeds.   RESPIRATORY: No cough, wheezing and shortness of breath.   CARDIOVASCULAR: Negative for chest pain, and palpitations.     GI: Negative for abdominal discomfort, blood in stools or black stools.  No recent change in bowel habits.   GU:  No history of incontinence.   MUSCLOSKELETAL: +history of joint pain or swelling.  No myalgias.   SKIN: Negative for lesions, rash, and itching.   HEMATOLOGY/ONCOLOGY: Negative for prolonged bleeding, bruising easily, and swollen nodes.  No history of cancer.   ENDOCRINE: Negative for cold or heat intolerance, polydipsia or goiter.   PSYCH:  +depression or anxiety symptoms.   NEURO: As Above.   Vital Signs:  BP 110/70   Pulse 93   Resp 12   SpO2 96%    General Medical Exam:   General:  Frail, elderly-appearing sitting in wheelchair, uncomfortable due to motion sickness.    Neurological Exam: MENTAL STATUS including orientation to time, place, person, recent and remote memory, attention  span and concentration, language, and fund of knowledge is fairly intact.  Speech is not dysarthric.    CRANIAL NERVES: II:  No visual field defects.   III-IV-VI: Pupils equal round and reactive to light.  Normal conjugate, extra-ocular eye movements in all directions of gaze.  No nystagmus.  No ptosis.   V:  Normal facial sensation.    VII:  Normal facial symmetry and movements.   VIII:  Normal hearing and vestibular function.   IX-X:  Normal palatal movement.   XI:  Normal shoulder shrug and head rotation.   XII:  Normal tongue strength and range of motion, no deviation or fasciculation.  MOTOR:  Generalized loss of muscle bulk throughout. Left leg is inverted and flail.   No fasciculations or abnormal movements.  No pronator drift.      Right Upper Extremity:    Left Upper Extremity:    Deltoid  5/5   Deltoid  5/5   Biceps  5/5   Biceps  5/5   Triceps  5/5   Triceps  5/5   Wrist extensors  5/5   Wrist extensors  5/5   Wrist flexors  5/5   Wrist flexors  5/5   Finger extensors  5/5   Finger extensors  5/5   Finger flexors  5/5   Finger flexors  5/5   Dorsal interossei  5/5    Dorsal interossei  5/5   Abductor pollicis  5/5   Abductor pollicis  5/5   Tone (Ashworth scale)  0  Tone (Ashworth scale)  0   Right Lower Extremity:    Left Lower Extremity:    Hip flexors  4/5   Hip flexors  1/5   Hip extensors  4/5   Hip extensors  1/5   Knee flexors  4/5   Knee flexors  0/5   Knee extensors  4/5   Knee extensors  0/5   Dorsiflexors  4/5   Dorsiflexors  0/5   Plantarflexors  4/5   Plantarflexors  0/5   Toe extensors  4/5   Toe extensors  1/5   Toe flexors  4/5   Toe flexors  0/5   Tone (Ashworth scale)  0  Tone (Ashworth scale)  0   MSRs:  Right                                                                 Left brachioradialis 2+  brachioradialis 2+  biceps 2+  biceps 2+  triceps 2+  triceps 2+  patellar 3+  patellar 3+  ankle jerk 2+  ankle jerk 2+  Hoffman no  Hoffman no  plantar response down  plantar response up  Crossed adductors present No ankle clonus  SENSORY:  Normal and symmetric perception of light touch, pinprick, vibration throughout.  COORDINATION/GAIT: Normal finger-to- nose-finger.  Intact rapid alternating movements bilaterally.  Gait not tested as patient is nonambulatory.   IMPRESSION: Mrs. Jewel BaizeCraven is an unfortunate previously independent 83 year-old female who has experienced gradual deterioration in health since September 2018, initially with shortness of breath due to pulmonary embolism, then progressive weakness of the left > right leg to the point where she is nonambulatory since early October.  On exam, she has 4/5 strength in the  right leg and 0-1/5 in the left leg, with essentially flail left leg.  Reflexes are very brisk in the legs and there is extensor plantar response on the left.  Exam of the arms is normal.  These findings are concerning for myelopathy and she to be evaluated for compressive lesion.  I will order ASAP imaging of the lumbar region as well as thoracic spine, because of her known spinal cord stimulator at this  level.  There was initially concern about malignancy due to sclerotic lesion of the ribs, which was thought to be post-traumatic; nevertheless, because she is unable to get MRI (spinal cord stimulator - nonfunctional), contrasted imaging of the thoracic and lumbar region will be ordered to evaluate for mass.  Further recommendations will be based on the results.    Daughter was somewhat frustrated because she was told that imaging of the lumbar region was already performed and she was not getting any new information from me today. I explained that she had CT chest/abd/pelvis while hospitalized, CT head and cervical spine at ER visit on 11/10 for fall, but that I did not see imaging of the lumbar region.  CT lumbar spine was ordered by her PCP and later cancelled by patient/family as they thought it was already performed.  I apologized for any misunderstandings and emphasized that imaging is necessary next step to investigate cause for patient's weakness.    Thank you for allowing me to participate in patient's care.  If I can answer any additional questions, I would be pleased to do so.    Sincerely,    Jaskaran Dauzat K. Allena KatzPatel, DO

## 2017-10-24 NOTE — Progress Notes (Signed)
ct 

## 2017-10-31 ENCOUNTER — Ambulatory Visit
Admission: RE | Admit: 2017-10-31 | Discharge: 2017-10-31 | Disposition: A | Discharge status: Home / Self Care | Payer: Medicare Other | Source: Ambulatory Visit | Attending: Neurology | Admitting: Neurology

## 2017-10-31 ENCOUNTER — Telehealth: Payer: Self-pay | Admitting: Neurology

## 2017-10-31 ENCOUNTER — Other Ambulatory Visit: Payer: Self-pay | Admitting: *Deleted

## 2017-10-31 ENCOUNTER — Other Ambulatory Visit: Payer: Self-pay | Admitting: Neurology

## 2017-10-31 DIAGNOSIS — G959 Disease of spinal cord, unspecified: Secondary | ICD-10-CM

## 2017-10-31 DIAGNOSIS — M546 Pain in thoracic spine: Secondary | ICD-10-CM

## 2017-10-31 DIAGNOSIS — R29898 Other symptoms and signs involving the musculoskeletal system: Secondary | ICD-10-CM

## 2017-10-31 DIAGNOSIS — M5136 Other intervertebral disc degeneration, lumbar region: Secondary | ICD-10-CM

## 2017-10-31 NOTE — Telephone Encounter (Signed)
They are needing a the order changed to Lumbar without and Thoracic Spine without. She will be going there shortly for her CT Scan. Please Call. Thanks

## 2017-10-31 NOTE — Telephone Encounter (Signed)
New orders placed

## 2017-11-01 ENCOUNTER — Telehealth: Payer: Self-pay | Admitting: Neurology

## 2017-11-01 NOTE — Telephone Encounter (Signed)
Please advise 

## 2017-11-01 NOTE — Telephone Encounter (Signed)
Left message for Lydia Barber that she is not on DPR but we have called her dad and left message for him to call us back.

## 2017-11-01 NOTE — Telephone Encounter (Signed)
Patient daughter wants the result of the Ct scan

## 2017-11-01 NOTE — Telephone Encounter (Signed)
I attempted to contact patient's husband via phone today regarding the results of CT thoracic and lumbar, however there was no answer so a message was left for the patient to return my call.   Daughter is not listed on DPR.   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  I have personally reviewed her imaging with radiology including cervical, thoracic, and lumbar images. There are two area where severe narrowing is present: (1) She has a vertebral fracture at T12 which may be compressing the spinal cord in the upper lumbar region and (2) lower cervical spine shows stenosis at C7-T1 (which we had previously discussed), but with her severe left leg weakness, wanted to also look at the lower spine levels.  To be able to better determine which one of these areas are causing spinal impingement, recommend neurosurgical evaluation to consider myelogram, but overall need to determine how aggressive they would like to be with her care, and if she would want to undergo surgery.  I will try them again tomorrow.   Laelani Vasko K. Allena KatzPatel, DO

## 2017-11-02 ENCOUNTER — Telehealth: Payer: Self-pay | Admitting: Neurology

## 2017-11-02 NOTE — Telephone Encounter (Signed)
Wants the test results please call

## 2017-11-02 NOTE — Telephone Encounter (Signed)
I also discussed these findings with patient's daughter. Management options including neurosurgical consultation for evaluation/CT myelogram vs. palliative care discussed, especially given the frail condition of patient. They will discuss this and let us know which way they would like to proceed.  Donika K. Allena KatzPatel, DO

## 2017-11-02 NOTE — Telephone Encounter (Signed)
Dr. Allena KatzPatel spoke to patient's daughter.

## 2017-11-02 NOTE — Telephone Encounter (Signed)
Gave husband results.  He will discuss this with his daughter and call me tomorrow.

## 2017-12-29 DEATH — deceased

## 2018-11-23 IMAGING — CT CT ABD-PELV W/ CM
2 of 5 series · 16 of 46 positions shown, 18 images · IV contrast (iopamidol)
Comparison: None.

CLINICAL DATA: Evaluate malignancy or metastasis. Newly diagnosed
pulmonary emboli.

EXAM:
CT ABDOMEN AND PELVIS WITH CONTRAST
TECHNIQUE: Multidetector CT imaging of the abdomen and pelvis was performed
using the standard protocol following bolus administration of
intravenous contrast.
CONTRAST:  30mL 12R6NX-PXX IOPAMIDOL (12R6NX-PXX) INJECTION 61%,
<See Chart> 12R6NX-PXX IOPAMIDOL (12R6NX-PXX) INJECTION 61%

[Series 2: axial st · axial · 0.74mm/px · z∈[+1066,+1396]mm · 13 of 78 slices shown, 15 images]
[im 6/78  soft-tissue]
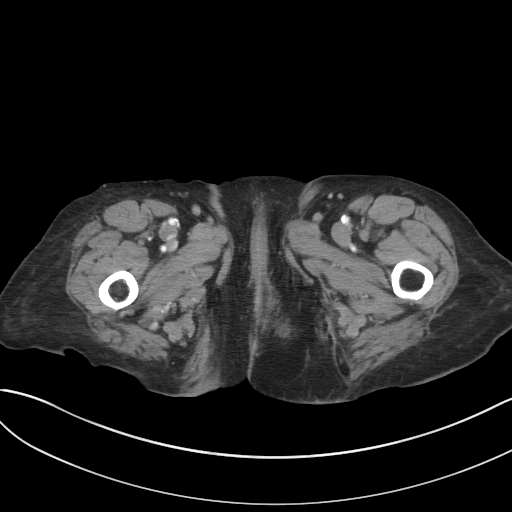
[im 6/78  bone]
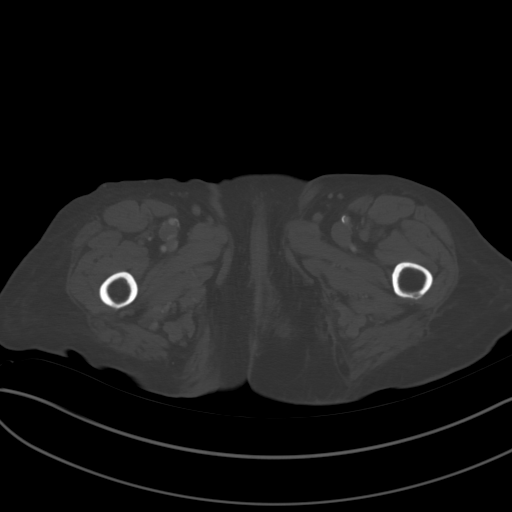
[im 12/78  soft-tissue]
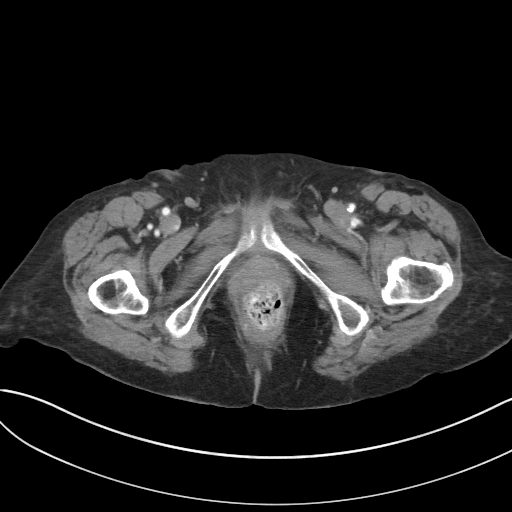
[im 17/78  soft-tissue]
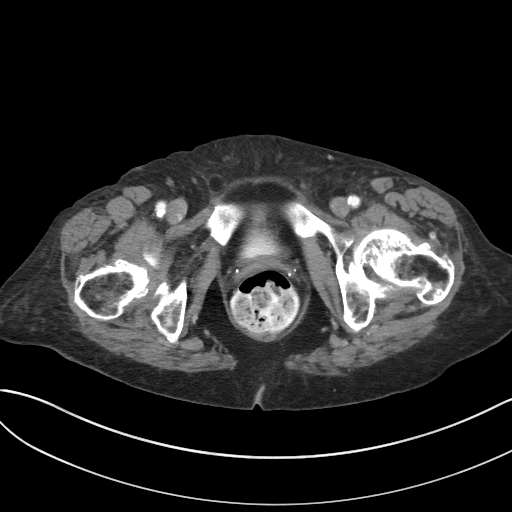
[im 23/78  soft-tissue]
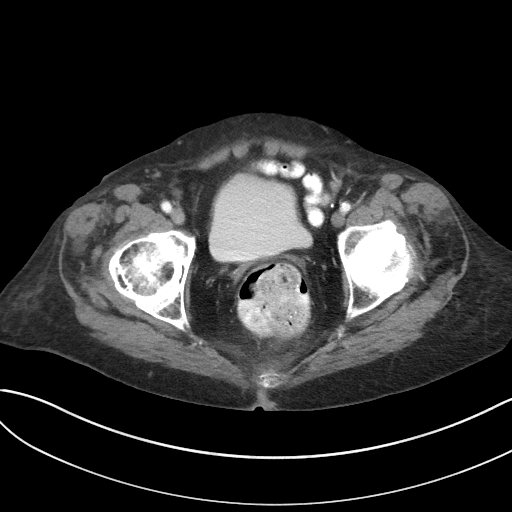
[im 28/78  soft-tissue]
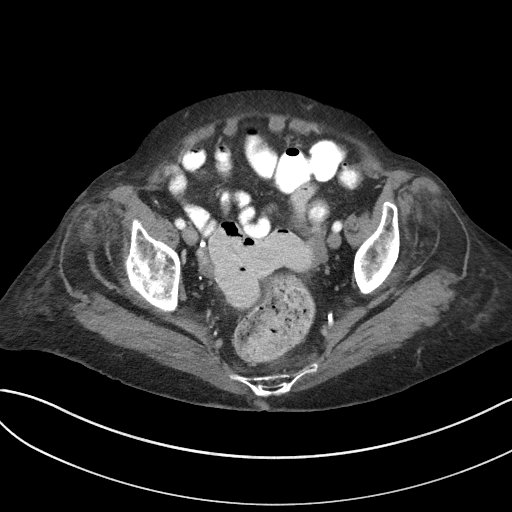
[im 34/78  soft-tissue]
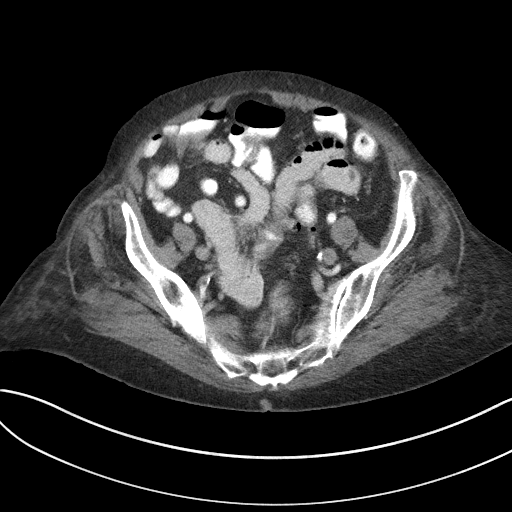
[im 39/78  soft-tissue]
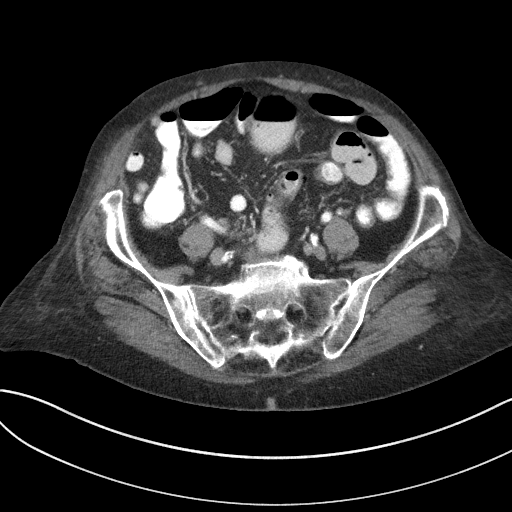
[im 45/78  soft-tissue]
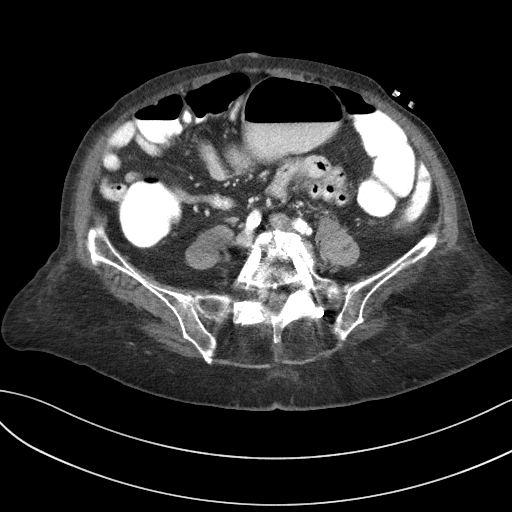
[im 50/78  soft-tissue]
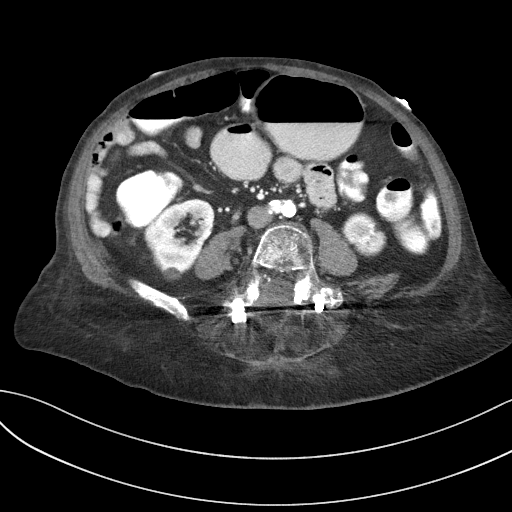
[im 50/78  bone]
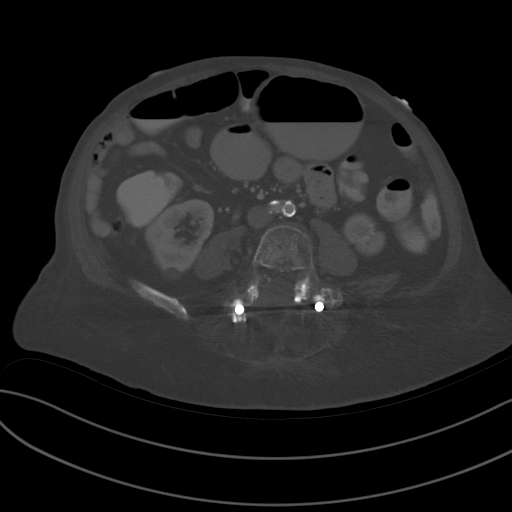
[im 56/78  soft-tissue]
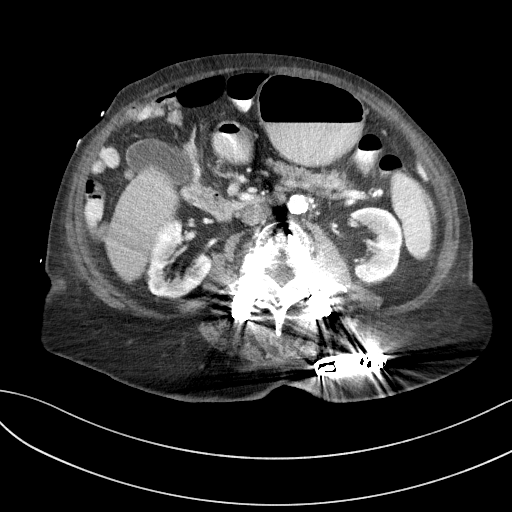
[im 61/78  soft-tissue]
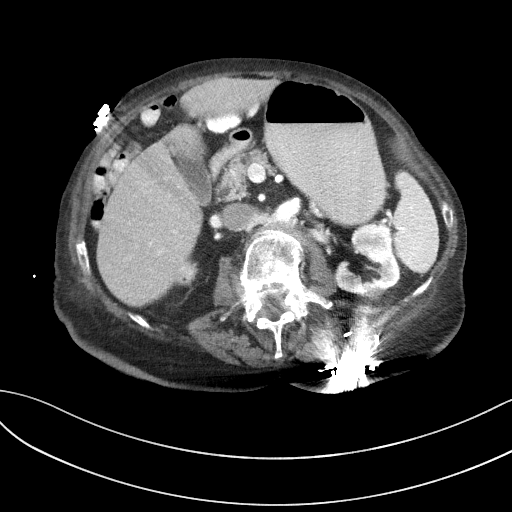
[im 67/78  soft-tissue]
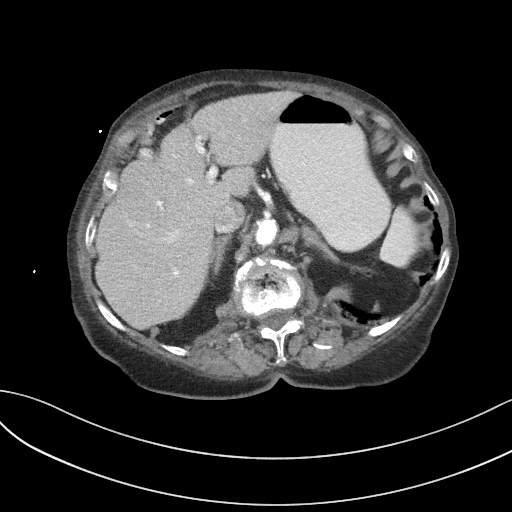
[im 72/78  soft-tissue]
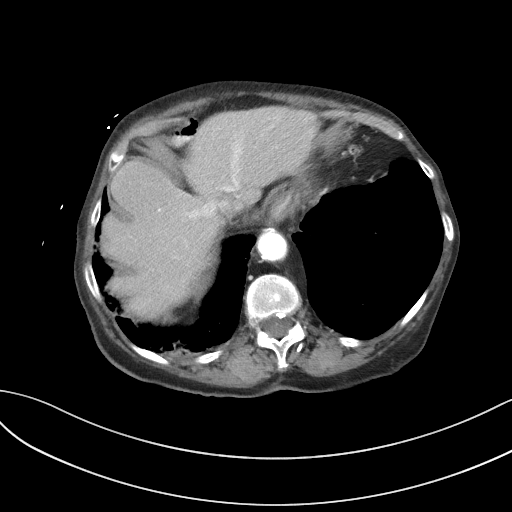

[Series 5: coronal st · coronal · 0.70mm/px · 3 of 100 slices shown]
[im 34/100  soft-tissue]
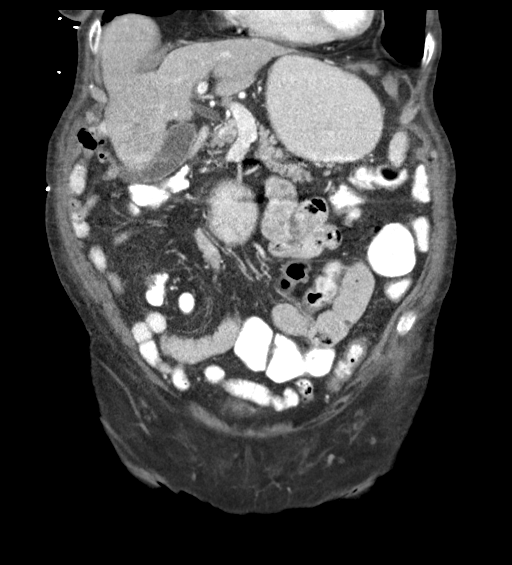
[im 45/100  soft-tissue]
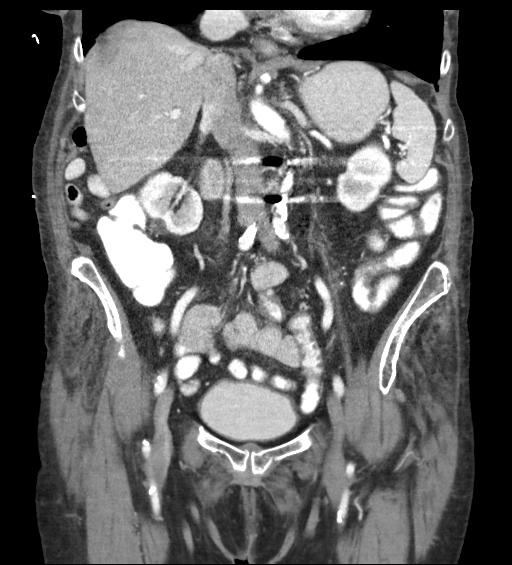
[im 56/100  soft-tissue]
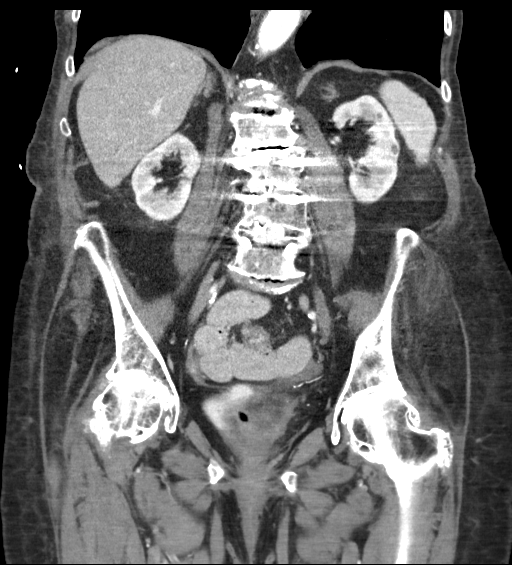

[16 of 46 positions shown; findings below may reference images not displayed]

FINDINGS: Lower chest: Mild atelectasis at the right lung base.

Hepatobiliary: No focal liver abnormality is seen. No gallstones,
gallbladder wall thickening, or biliary dilatation.

Pancreas: Unremarkable. No pancreatic ductal dilatation or
surrounding inflammatory changes.

Spleen: Normal in size without focal abnormality.

Adrenals/Urinary Tract: Adrenal glands are unremarkable. Multiple
renal cysts bilaterally. No suspicious mass. No renal stone or
hydronephrosis. Bladder appears normal.

Stomach/Bowel: Questionable thickening of the walls of the rectum
but not convincing. No dilated large or small bowel loops. Scattered
diverticulosis within the sigmoid and descending colon but no
evidence of acute diverticulitis. Stomach is unremarkable.

Vascular/Lymphatic: Aortic atherosclerosis. No enlarged lymph nodes
seen within the abdomen or pelvis.

Reproductive: Presumed hysterectomy.  No adnexal mass.

Other: No free fluid or abscess. No soft tissue mass. No free
intraperitoneal air.

Musculoskeletal: Fixation hardware within the lower lumbar spine
appears intact and appropriately positioned. Degenerative changes
throughout the slightly scoliotic lumbar spine, mild to moderate in
degree. Additional degenerative spurring noted at each hip joint.
Chronic appearing compression fracture deformity of the L1 vertebral
body, with approximately 7 mm retropulsion causing moderate central
canal stenosis and possible associated nerve root impingement.

No acute or suspicious osseous finding within the abdomen or pelvis.
Appearance of the lower ribs described on yesterday's chest CT
report.
IMPRESSION: 1. Questionable thickening of the walls of the rectum but not
convincing. Otherwise, no evidence of malignancy within the abdomen
or pelvis.
2. No acute findings within the abdomen or pelvis.
3. Colonic diverticulosis without evidence of acute diverticulitis.
4. Renal cysts.
5. Degenerative/chronic findings within the lumbar spine as detailed
above. No acute or suspicious finding within the lumbar spine or
osseous pelvis.
